# Patient Record
Sex: Male | Born: 1955 | Race: White | Hispanic: No | Marital: Married | Smoking: Never smoker
Health system: Southern US, Community
[De-identification: ages and names within clinical notes are randomized; demographics above are authoritative.]

## PROBLEM LIST (undated history)

## (undated) DIAGNOSIS — M719 Bursopathy, unspecified: Secondary | ICD-10-CM

## (undated) DIAGNOSIS — R42 Dizziness and giddiness: Secondary | ICD-10-CM

## (undated) DIAGNOSIS — J189 Pneumonia, unspecified organism: Secondary | ICD-10-CM

## (undated) DIAGNOSIS — R030 Elevated blood-pressure reading, without diagnosis of hypertension: Secondary | ICD-10-CM

## (undated) DIAGNOSIS — G479 Sleep disorder, unspecified: Secondary | ICD-10-CM

## (undated) DIAGNOSIS — F419 Anxiety disorder, unspecified: Secondary | ICD-10-CM

## (undated) DIAGNOSIS — R232 Flushing: Secondary | ICD-10-CM

## (undated) DIAGNOSIS — Z72820 Sleep deprivation: Secondary | ICD-10-CM

## (undated) HISTORY — DX: Anxiety disorder, unspecified: F41.9

## (undated) HISTORY — PX: APPENDECTOMY: SHX54

## (undated) HISTORY — DX: Pneumonia, unspecified organism: J18.9

## (undated) HISTORY — DX: Bursopathy, unspecified: M71.9

## (undated) HISTORY — DX: Sleep disorder, unspecified: G47.9

## (undated) HISTORY — DX: Flushing: R23.2

## (undated) HISTORY — DX: Sleep deprivation: Z72.820

## (undated) HISTORY — DX: Dizziness and giddiness: R42

## (undated) HISTORY — DX: Elevated blood-pressure reading, without diagnosis of hypertension: R03.0

---

## 2003-03-22 ENCOUNTER — Observation Stay (HOSPITAL_COMMUNITY): Admission: RE | Admit: 2003-03-22 | Discharge: 2003-03-23 | Payer: Self-pay | Admitting: Surgery

## 2003-03-23 ENCOUNTER — Encounter (INDEPENDENT_AMBULATORY_CARE_PROVIDER_SITE_OTHER): Payer: Self-pay | Admitting: *Deleted

## 2010-02-11 DIAGNOSIS — J189 Pneumonia, unspecified organism: Secondary | ICD-10-CM

## 2010-02-11 HISTORY — DX: Pneumonia, unspecified organism: J18.9

## 2010-03-02 ENCOUNTER — Encounter: Payer: Self-pay | Admitting: Critical Care Medicine

## 2010-03-02 ENCOUNTER — Encounter
Admission: RE | Admit: 2010-03-02 | Discharge: 2010-03-02 | Payer: Self-pay | Source: Home / Self Care | Attending: Emergency Medicine | Admitting: Emergency Medicine

## 2010-03-03 ENCOUNTER — Ambulatory Visit (HOSPITAL_COMMUNITY)
Admission: RE | Admit: 2010-03-03 | Discharge: 2010-03-03 | Payer: Self-pay | Source: Home / Self Care | Attending: Emergency Medicine | Admitting: Emergency Medicine

## 2010-03-05 ENCOUNTER — Ambulatory Visit
Admission: RE | Admit: 2010-03-05 | Discharge: 2010-03-05 | Payer: Self-pay | Source: Home / Self Care | Attending: Critical Care Medicine | Admitting: Critical Care Medicine

## 2010-03-15 NOTE — Assessment & Plan Note (Signed)
Summary: Pulmonary Consultation   Copy to:  Dr. Earl Lites Primary Provider/Referring Provider:  [name]  CC:  Pulmonary Consult - .  History of Present Illness: Pulmonary Consultation 55 yo WM with [name] started 4 weeks ago with fever, flu like ilness, cough , temp 100, pt worse since 1/11 .  Pt cough prod of yellow and green mucus.   Pt rx with zpak and then augmentin.   Seen in urgen care, CXR with wedge shaped infiltrate ?PE. pt rx lovenox an no ABX  Pt still has pain wiht deep breath.   Preventive Screening-Counseling & Management  Alcohol-Tobacco     Smoking Status: never  Current Medications (verified): 1)  Lovenox 120 Mg/0.32ml Soln (Enoxaparin Sodium) .... Once Daily  Allergies (verified): No Known Drug Allergies  Past History:  Past medical, surgical, family and social histories (including risk factors) reviewed, and no changes noted (except as noted below).  Past Surgical History: Appendectomy  Family History: Reviewed history and no changes required. PGF - heart disease mother - ovarian ca MGM - breast ca  Social History: Reviewed history and no changes required. 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  The patient denies shortness of breath with activity, shortness of breath at rest, non-productive cough, coughing up blood, irregular heartbeats, acid heartburn, indigestion, loss of appetite, weight change, abdominal pain, difficulty swallowing, sore throat, tooth/dental problems, headaches, nasal congestion/difficulty breathing through nose, sneezing, itching, ear ache, anxiety, depression, hand/feet swelling, joint stiffness or pain, rash, change in color of mucus, and fever.    Vital Signs:  Patient profile:   55 year old male Height:      69.5 inches Weight:      171.13 pounds BMI:      25.00 O2 Sat:      99 % on Room air Temp:     98.4 degrees F oral Pulse rate:   92 / minute BP sitting:   120 / 78  (left arm) Cuff size:   regular  Vitals Entered By: Gweneth Dimitri RN (March 05, 2010 10:41 AM)  O2 Flow:  Room air CC: Pulmonary Consult -  Comments Medications reviewed with patient Daytime contact number verified with patient. Gweneth Dimitri RN  March 05, 2010 10:41 AM    Physical Exam  Additional Exam:  Gen: Pleasant, well-nourished, in no distress,  normal affect ENT: No lesions,  mouth clear,  oropharynx clear, no postnasal drip Neck: No JVD, no TMG, no carotid bruits Lungs: No use of accessory muscles, no dullness to percussion, consolidative changes RML Cardiovascular: RRR, heart sounds normal, no murmur or gallops, no peripheral edema Abdomen: soft and NT, no HSM,  BS normal Musculoskeletal: No deformities, no cyanosis or clubbing Neuro: alert, non focal Skin: Warm, no lesions or rashes    Impression & Recommendations:  Problem # 1:  PNEUMONIA, ORGANISM UNSPECIFIED (ICD-486) Assessment Unchanged CT reviewd,  this is not PE, this is CAP with wedge shaped infiltrate  plan avelox x 7days d/c lovenox His updated medication list for this problem includes:    Avelox 400 Mg Tabs (Moxifloxacin hcl) ..... By mouth daily  Orders: New Patient Level V (28413)  Medications Added to Medication List This Visit: 1)  Avelox 400 Mg Tabs (Moxifloxacin hcl) .... By mouth daily  Complete Medication List: 1)  Avelox  400 Mg Tabs (Moxifloxacin hcl) .... By mouth daily  Patient Instructions: 1)  Stop lovenox,  you do not have a pulmonary embolism 2)  Avelox one daily until gone (2 samples, 5 sent to [facility]) 3)  Push fluids 4)  Return 3-4 weeks with chest xray first  5)  You are safe to travel Prescriptions: AVELOX 400 MG  TABS (MOXIFLOXACIN HCL) By mouth daily  #5 x 0   Entered and Authorized by:   Storm Frisk MD   Signed by:   Storm Frisk MD on  03/05/2010   Method used:   Electronically to        [facility] [address] [phone]* (retail)       [address]       [place], [place]  [address]       Ph: [phone] or [phone]       Fax: [phone]   RxID:   [id number]   Appended Document: Pulmonary Consultation fax steve daub

## 2010-03-17 ENCOUNTER — Encounter: Payer: Self-pay | Admitting: Critical Care Medicine

## 2010-03-18 ENCOUNTER — Encounter: Payer: Self-pay | Admitting: Critical Care Medicine

## 2010-04-06 ENCOUNTER — Ambulatory Visit (INDEPENDENT_AMBULATORY_CARE_PROVIDER_SITE_OTHER): Payer: BC Managed Care – PPO | Admitting: Critical Care Medicine

## 2010-04-06 ENCOUNTER — Encounter: Payer: Self-pay | Admitting: Critical Care Medicine

## 2010-04-06 DIAGNOSIS — J189 Pneumonia, unspecified organism: Secondary | ICD-10-CM

## 2010-04-10 NOTE — Assessment & Plan Note (Signed)
Summary: Pulmonary OV   Copy to:  Dr. Earl Lites Primary Provider/Referring Provider:  Pierce Crane  CC:  4 wk follow up.  Pt states he has improved a great deal.  Denies SOB, wheezing, chest tightness, cough, and back pains..  History of Present Illness: Pulmonary OV 55 yo WM with [name] started 4 weeks ago with fever, flu like ilness, cough , temp 100, pt worse since 1/11 .  Pt cough prod of yellow and green mucus.   Pt rx with zpak and then augmentin.   Seen in urgen care, CXR with wedge shaped infiltrate ?PE. pt rx lovenox an no ABX  Pt still has pain wiht deep breath.    April 06, 2010 9:18 AM no furhter pain or ocugh. The pt notes  in the last week he  is better The pt had abx refilled and did another round  in jan 2012.   Now is better No oether new issues. CXR done 03/18/10: infiltrate had cleared  Current Medications (verified): 1)  None  Allergies (verified): No Known Drug Allergies   Past History:  Past medical, surgical, family and social histories (including risk factors) reviewed, and no changes noted (except as noted below).  Past Surgical History: Reviewed history from 03/05/2010 and no changes required. Appendectomy  Family History: Reviewed history from 03/05/2010 and no changes required. PGF - heart disease mother - ovarian ca MGM - breast ca  Social History: Reviewed history from 03/05/2010 and no changes required. 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    Vital Signs:  Patient profile:   55 year old male Height:      69.5 inches Weight:      173.56 pounds BMI:     25.35 O2 Sat:      99 % on Room air Temp:     98.0 degrees F oral Pulse rate:   76 / minute BP sitting:   122 / 80  (left arm) Cuff size:   regular  Vitals Entered By: Gweneth Dimitri RN (April 06, 2010 9:09 AM)  O2 Flow:  Room air CC: 4 wk follow up.  Pt states he has improved a great deal.  Denies SOB, wheezing, chest tightness, cough, back pains. Comments Medications reviewed with patient Daytime contact number verified with patient. Gweneth Dimitri RN  April 06, 2010 9:11 AM    Physical Exam  Additional Exam:  Gen: Pleasant, well-nourished, in no distress,  normal affect ENT: No lesions,  mouth clear,  oropharynx clear, no postnasal drip Neck: No JVD, no TMG, no carotid bruits Lungs: No use of accessory muscles, no dullness to percussion,  clear Cardiovascular: RRR, heart sounds normal, no murmur or gallops, no peripheral edema Abdomen: soft and NT, no HSM,  BS normal Musculoskeletal: No deformities, no cyanosis or clubbing Neuro: alert, non focal Skin: Warm, no lesions or rashes    CXR  Procedure date:  03/18/2010  Findings:      RUL infiltrate has completely resolved   Impression & Recommendations:  Problem # 1:  PNEUMONIA, ORGANISM UNSPECIFIED (ICD-486) Assessment Improved Resolved CAP RUL.  No pulmonary embolism.  Hypercoaguable w/u neg except for heterozygote for Factor V leiden, likely not pertinent in this case, recent cxr 03/18/10: pna resolved plan return as needed  Orders: T-2 View CXR (71020TC) Est. Patient Level III (30865)  Patient Instructions: 1)  Return as needed 2)  No further lung work up  Appended Document: Pulmonary OV fax Earl Lites

## 2010-04-25 ENCOUNTER — Encounter: Payer: Self-pay | Admitting: Oncology

## 2010-06-29 NOTE — Op Note (Signed)
NAME:  GIORDAN, FORDHAM                         ACCOUNT NO.:  [id number]   MEDICAL RECORD NO.:  [id number]                   PATIENT TYPE:  OBV   LOCATION:  0102                                 FACILITY:  [facility]   PHYSICIAN:  Sharlet Salina T. Hoxworth, M.D.          DATE OF BIRTH:  24-Feb-1955   DATE OF PROCEDURE:  03/22/2003  DATE OF DISCHARGE:                                 OPERATIVE REPORT   PREOPERATIVE DIAGNOSIS:  Acute appendicitis.   POSTOPERATIVE DIAGNOSIS:  Acute appendicitis.   SURGICAL PROCEDURE:  Laparoscopic appendectomy.   SURGEON:  Lorne Skeens. Hoxworth, M.D.   ANESTHESIA:  General.   BRIEF HISTORY:  Mr. Zuercher is a 55 year old male who presents with just over  a 24-hour history of progressive periumbilical and then right lower quadrant  abdominal pain associated with decreased appetite.  He has moderate right  lower quadrant tenderness on exam but no peritoneal signs.  CT scan was  obtained due to an equivocal physical exam, and this shows evidence of early  appendicitis.  Laparoscopic appendectomy has been recommended and accepted.  The nature of the procedure, indications, risks of bleeding, infection were  discussed and understood.  He is now brought to the operating room for the  procedure.   DESCRIPTION OF OPERATION:  The patient was brought to the operating room and  placed in the supine position on the operating table, and a general  endotracheal anesthesia was induced.  He received preoperative broad-  spectrum antibiotics.  A Foley catheter was placed.  The abdomen was widely  sterilely prepped and draped.  Local anesthesia was used to infiltrate the  trocar sites prior to the incision.  A 1 cm incision was made in the  umbilicus and dissection carried down to the midline fascia, which was  sharply incised for 1 cm, the peritoneum entered under direct vision.  Through a mattress suture of 0 Vicryl the Hasson trocar was placed and  pneumoperitoneum established.   Under direct vision a 5 mm trocar was placed  in the right upper quadrant and a 12 mm in the left lower quadrant.  The  appendix was exposed and was acutely inflamed without evidence of gangrene  or perforation.  The appendix was mobilized from inflammatory adhesions and  then further mobilized dividing the lateral peritoneal attachments with the  Harmonic scalpel.  The base was not inflamed.  The mesoappendix was then  sequentially divided with the Harmonic scalpel until the appendix was  completely freed down to its base.  This was then divided across the base  with a single firing of the Endo-GIA red load stapler.  The appendix was  placed in an EndoCatch bag and brought out through the umbilicus.  The  abdomen was then thoroughly irrigated and hemostasis assured.  The trocars  were removed under direct vision and all CO2 evacuated.  The mattress suture  was secured to the umbilicus.  Skin incisions were  closed with interrupted  subcuticular 4-0 Monocryl and Steri-Strips.  Sponge, needle, and instrument  counts were correct.  Dry sterile dressings were applied and the patient  taken to the recovery room in good condition.                                               Lorne Skeens. Hoxworth, M.D.   Tory Emerald  D:  03/22/2003  T:  03/23/2003  Job:  [id number]

## 2012-07-29 ENCOUNTER — Ambulatory Visit (INDEPENDENT_AMBULATORY_CARE_PROVIDER_SITE_OTHER): Payer: BC Managed Care – PPO | Admitting: Family Medicine

## 2012-07-29 VITALS — BP 159/99 | HR 85 | Temp 98.5°F | Resp 16 | Ht 70.0 in | Wt 176.0 lb

## 2012-07-29 DIAGNOSIS — J189 Pneumonia, unspecified organism: Secondary | ICD-10-CM

## 2012-07-29 LAB — POCT CBC
Granulocyte percent: 60.6 %G (ref 37–80)
HCT, POC: 43.7 % (ref 43.5–53.7)
Hemoglobin: 14.3 g/dL (ref 14.1–18.1)
Lymph, poc: 1.9 (ref 0.6–3.4)
MCH, POC: 29.2 pg (ref 27–31.2)
MCHC: 32.7 g/dL (ref 31.8–35.4)
MCV: 89.1 fL (ref 80–97)
MID (cbc): 0.5 (ref 0–0.9)
MPV: 9.4 fL (ref 0–99.8)
POC Granulocyte: 3.6 (ref 2–6.9)
POC LYMPH PERCENT: 31.7 %L (ref 10–50)
POC MID %: 7.7 %M (ref 0–12)
Platelet Count, POC: 248 10*3/uL (ref 142–424)
RBC: 4.9 M/uL (ref 4.69–6.13)
RDW, POC: 12.9 %
WBC: 6 10*3/uL (ref 4.6–10.2)

## 2012-07-29 MED ORDER — AZITHROMYCIN 250 MG PO TABS: ORAL_TABLET | ORAL | Status: DC

## 2012-07-29 NOTE — Progress Notes (Signed)
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 He has a history of pneumonia which began exactly like this in the past. 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 He's had some mild nausea but no vomiting, no diarrhea.  Objective: HEENT unremarkable with exception of some mild uvular erythema. Neck: Supple no adenopathy Chest: Coarse breath sounds bilateral with some rales in the right lower base. Skin: Unremarkable  Heart: Regular no murmur Extremities: No edema.  Assessment: Early pneumonia  Plan: Check CBC, metabolic profile, PSA since patient is not having lab test in 2 years.  Signed, Sheila Oats.D.

## 2012-07-30 LAB — COMPREHENSIVE METABOLIC PANEL
ALT: 18 U/L (ref 0–53)
AST: 18 U/L (ref 0–37)
Albumin: 4.4 g/dL (ref 3.5–5.2)
Alkaline Phosphatase: 42 U/L (ref 39–117)
BUN: 19 mg/dL (ref 6–23)
CO2: 29 mEq/L (ref 19–32)
Calcium: 9.8 mg/dL (ref 8.4–10.5)
Chloride: 101 mEq/L (ref 96–112)
Creat: 1.2 mg/dL (ref 0.50–1.35)
Glucose, Bld: 85 mg/dL (ref 70–99)
Potassium: 4.1 mEq/L (ref 3.5–5.3)
Sodium: 139 mEq/L (ref 135–145)
Total Bilirubin: 0.8 mg/dL (ref 0.3–1.2)
Total Protein: 6.9 g/dL (ref 6.0–8.3)

## 2012-07-30 LAB — PSA: PSA: 0.97 ng/mL (ref <=4.00)

## 2012-08-19 ENCOUNTER — Ambulatory Visit (INDEPENDENT_AMBULATORY_CARE_PROVIDER_SITE_OTHER): Payer: BC Managed Care – PPO | Admitting: Critical Care Medicine

## 2012-08-19 ENCOUNTER — Encounter: Payer: Self-pay | Admitting: Critical Care Medicine

## 2012-08-19 ENCOUNTER — Ambulatory Visit (INDEPENDENT_AMBULATORY_CARE_PROVIDER_SITE_OTHER)
Admission: RE | Admit: 2012-08-19 | Discharge: 2012-08-19 | Disposition: A | Discharge status: Home / Self Care | Payer: BC Managed Care – PPO | Source: Ambulatory Visit | Attending: Critical Care Medicine | Admitting: Critical Care Medicine

## 2012-08-19 ENCOUNTER — Other Ambulatory Visit (INDEPENDENT_AMBULATORY_CARE_PROVIDER_SITE_OTHER): Payer: BC Managed Care – PPO

## 2012-08-19 VITALS — BP 136/82 | HR 94 | Temp 98.3°F | Ht 69.0 in | Wt 178.4 lb

## 2012-08-19 DIAGNOSIS — J189 Pneumonia, unspecified organism: Secondary | ICD-10-CM

## 2012-08-19 DIAGNOSIS — J180 Bronchopneumonia, unspecified organism: Secondary | ICD-10-CM

## 2012-08-19 LAB — CBC WITH DIFFERENTIAL/PLATELET
Basophils Absolute: 0 10*3/uL (ref 0.0–0.1)
Basophils Relative: 0.2 % (ref 0.0–3.0)
Eosinophils Absolute: 0.1 10*3/uL (ref 0.0–0.7)
Hemoglobin: 15.5 g/dL (ref 13.0–17.0)
Lymphocytes Relative: 27 % (ref 12.0–46.0)
MCHC: 34.3 g/dL (ref 30.0–36.0)
MCV: 86 fl (ref 78.0–100.0)
Neutrophils Relative %: 63.4 % (ref 43.0–77.0)
Platelets: 206 10*3/uL (ref 150.0–400.0)
RDW: 13 % (ref 11.5–14.6)
WBC: 6.1 10*3/uL (ref 4.5–10.5)

## 2012-08-19 LAB — BASIC METABOLIC PANEL: CO2: 34 mEq/L — ABNORMAL HIGH (ref 19–32)

## 2012-08-19 MED ORDER — LEVOFLOXACIN 500 MG PO TABS: 500.0000 mg | ORAL_TABLET | Freq: Every day | ORAL | Status: DC

## 2012-08-19 NOTE — Progress Notes (Signed)
Subjective:    Patient ID: Darren Hess, male    DOB: 1955-02-27, 57 y.o.   MRN: [id number]  HPI  08/19/2012 Chief Complaint  Patient presents with  . Acute Visit    Last seen 03/2012.  Soreness in back, fatigue.  Feels same as did in past with pna.    Started illness for 7weeks coughing mucus, yellow. Pain in R film.  No cxr done.  Avelox x 5, then Zpak later. Now is better, pain went away and now back. Ache all night.  Now no fever, none before. No sinus issues.  No edema in legs  Symptoms are very similar to presentation 2012 on right middle lobe pneumonia was diagnosed and successfully treated with Avelox   Pt denies any significant sore throat, nasal congestion or excess secretions, fever, chills, sweats, unintended weight loss, pleurtic or exertional chest pain, orthopnea PND, or leg swelling Pt denies any increase in rescue therapy over baseline, denies waking up needing it or having any early am or nocturnal exacerbations of coughing/wheezing/or dyspnea. Pt also denies any obvious fluctuation in symptoms with  weather or environmental change or other alleviating or aggravating factors    Review of Systems Constitutional:   No  weight loss, night sweats,  Fevers, chills, fatigue, lassitude. HEENT:   No headaches,  Difficulty swallowing,  Tooth/dental problems,  Sore throat,                No sneezing, itching, ear ache, nasal congestion, post nasal drip,   CV:  ++ chest pain,  Orthopnea, PND, swelling in lower extremities, anasarca, dizziness, palpitations  GI  No heartburn, indigestion, abdominal pain, nausea, vomiting, diarrhea, change in bowel habits, loss of appetite  Resp: ++ shortness of breath with exertion not at rest.  No excess mucus, ++ productive cough,  No non-productive cough,  No coughing up of blood.  No change in color of mucus.  No wheezing.  No chest wall deformity  Skin: no rash or lesions.  GU: no dysuria, change in color of urine, no urgency or  frequency.  No flank pain.  MS:  No joint pain or swelling.  No decreased range of motion.  No back pain. Note right posterior chest wall pain and right arm pain  Psych:  No change in mood or affect. No depression or anxiety.  No memory loss.     Objective:   Physical Exam Filed Vitals:   08/19/12 1404  BP: 136/82  Pulse: 94  Temp: 98.3 F (36.8 C)  TempSrc: Oral  Height: 5\' 9"  (1.753 m)  Weight: 178 lb 6.4 oz (80.922 kg)  SpO2: 96%    Gen: Pleasant, well-nourished, in no distress,  normal affect  ENT: No lesions,  mouth clear,  oropharynx clear, no postnasal drip  Neck: No JVD, no TMG, no carotid bruits  Lungs: No use of accessory muscles, no dullness to percussion, faint rales right middle lung zone  Cardiovascular: RRR, heart sounds normal, no murmur or gallops, no peripheral edema  Abdomen: soft and NT, no HSM,  BS normal  Musculoskeletal: No deformities, no cyanosis or clubbing  Neuro: alert, non focal  Skin: Warm, no lesions or rashes  Dg Chest 2 View  08/19/2012   *RADIOLOGY REPORT*  Clinical Data: Back pain, possible pneumonia  CHEST - 2 VIEW  Comparison: 03/02/2010  Findings: Cardiomediastinal silhouette is unremarkable.  No acute infiltrate or pleural effusion.  No pulmonary edema.  Bony thorax is unremarkable.  IMPRESSION: No active disease.  Original Report Authenticated By: Natasha Mead, M.D.          Assessment & Plan:   Bronchopneumonia History right middle lobe pneumonia in 2012 pleural-based now with symptoms very similar to prior presentation. Chest x-ray findings not supportive but physical exam and history suggestive of at least tracheobronchitis with associated mild bronchopneumonia right lung Plan Given extension of antibiotics with Levaquin 500 milligrams daily for 7 days Return in 2 weeks for recheck Note CBC unremarkable at this visit   Updated Medication List Outpatient Encounter Prescriptions as of 08/19/2012  Medication Sig Dispense  Refill  . levofloxacin (LEVAQUIN) 500 MG tablet Take 1 tablet (500 mg total) by mouth daily.  10 tablet  0  . [DISCONTINUED] azithromycin (ZITHROMAX Z-PAK) 250 MG tablet Take as directed on pack  6 tablet  0   No facility-administered encounter medications on file as of 08/19/2012.

## 2012-08-19 NOTE — Patient Instructions (Addendum)
Lab today: BMET, CBC Chest xray today Take levaquin 500mg  daily for 10days Return 2 weeks

## 2012-08-20 DIAGNOSIS — J180 Bronchopneumonia, unspecified organism: Secondary | ICD-10-CM | POA: Insufficient documentation

## 2012-08-20 NOTE — Assessment & Plan Note (Signed)
History right middle lobe pneumonia in 2012 pleural-based now with symptoms very similar to prior presentation. Chest x-ray findings not supportive but physical exam and history suggestive of at least tracheobronchitis with associated mild bronchopneumonia right lung Plan Given extension of antibiotics with Levaquin 500 milligrams daily for 7 days Return in 2 weeks for recheck Note CBC unremarkable at this visit

## 2012-08-21 ENCOUNTER — Ambulatory Visit: Payer: BC Managed Care – PPO | Admitting: Internal Medicine

## 2012-09-04 ENCOUNTER — Ambulatory Visit: Payer: BC Managed Care – PPO | Admitting: Critical Care Medicine

## 2012-11-23 ENCOUNTER — Ambulatory Visit: Payer: BC Managed Care – PPO | Admitting: Emergency Medicine

## 2012-11-30 ENCOUNTER — Other Ambulatory Visit: Payer: Self-pay | Admitting: Radiology

## 2012-11-30 MED ORDER — ALPRAZOLAM 0.5 MG PO TABS: 0.2500 mg | ORAL_TABLET | Freq: Every evening | ORAL | Status: DC | PRN

## 2012-11-30 NOTE — Telephone Encounter (Signed)
Phoned in Alprazolam.

## 2013-02-01 ENCOUNTER — Telehealth: Payer: Self-pay

## 2013-02-01 ENCOUNTER — Ambulatory Visit (INDEPENDENT_AMBULATORY_CARE_PROVIDER_SITE_OTHER): Payer: BC Managed Care – PPO | Admitting: Family Medicine

## 2013-02-01 VITALS — BP 158/92 | HR 101 | Temp 98.0°F | Resp 16 | Ht 70.0 in | Wt 174.0 lb

## 2013-02-01 DIAGNOSIS — R42 Dizziness and giddiness: Secondary | ICD-10-CM

## 2013-02-01 DIAGNOSIS — F411 Generalized anxiety disorder: Secondary | ICD-10-CM

## 2013-02-01 DIAGNOSIS — R232 Flushing: Secondary | ICD-10-CM

## 2013-02-01 DIAGNOSIS — R05 Cough: Secondary | ICD-10-CM

## 2013-02-01 DIAGNOSIS — F419 Anxiety disorder, unspecified: Secondary | ICD-10-CM

## 2013-02-01 DIAGNOSIS — IMO0001 Reserved for inherently not codable concepts without codable children: Secondary | ICD-10-CM

## 2013-02-01 DIAGNOSIS — R030 Elevated blood-pressure reading, without diagnosis of hypertension: Secondary | ICD-10-CM

## 2013-02-01 DIAGNOSIS — Z72820 Sleep deprivation: Secondary | ICD-10-CM

## 2013-02-01 DIAGNOSIS — R059 Cough, unspecified: Secondary | ICD-10-CM

## 2013-02-01 DIAGNOSIS — G479 Sleep disorder, unspecified: Secondary | ICD-10-CM

## 2013-02-01 HISTORY — DX: Flushing: R23.2

## 2013-02-01 HISTORY — DX: Sleep disorder, unspecified: G47.9

## 2013-02-01 HISTORY — DX: Sleep deprivation: Z72.820

## 2013-02-01 HISTORY — DX: Dizziness and giddiness: R42

## 2013-02-01 HISTORY — DX: Reserved for inherently not codable concepts without codable children: IMO0001

## 2013-02-01 LAB — POCT CBC
Granulocyte percent: 69.8 %G (ref 37–80)
HCT, POC: 49.1 % (ref 43.5–53.7)
Hemoglobin: 15.7 g/dL (ref 14.1–18.1)
Lymph, poc: 2.4 (ref 0.6–3.4)
MCV: 90.5 fL (ref 80–97)
MPV: 9.2 fL (ref 0–99.8)
POC Granulocyte: 7.2 — AB (ref 2–6.9)
POC LYMPH PERCENT: 23.4 %L (ref 10–50)
RBC: 5.42 M/uL (ref 4.69–6.13)
RDW, POC: 13.4 %
WBC: 10.3 10*3/uL — AB (ref 4.6–10.2)

## 2013-02-01 MED ORDER — ZOLPIDEM TARTRATE 5 MG PO TABS: 5.0000 mg | ORAL_TABLET | Freq: Every evening | ORAL | Status: DC | PRN

## 2013-02-01 NOTE — Telephone Encounter (Signed)
Fayette needs to come in and be checked. He needs a good thorough physical examination and evaluation to see what is the appropriate medications for him to be on. He can certainly come into the walk-in clinic now go ahead and get started on medication and then I can followup with him by appointment.

## 2013-02-01 NOTE — Telephone Encounter (Signed)
Left message for him to call me, he should come in for this. To you FYI

## 2013-02-01 NOTE — Progress Notes (Signed)
Subjective: All male patient of Dr. Deforest Hoyles who is here with a history of noting his blood pressure be elevated over the last few weeks. He  has felt a little abnormal in his head with a little mild dizziness but no loss of coordination or inability to do things from it. He has felt flushed for the last few days. He's had a little nonspecific rash below his breast level on the lower chest wall anteriorly. He has not been able to sleep. They have had a lot of stress worrying about her son who had major liver problems and a critical care hospital stay. Generally he has been healthy. He does not smoke. He is on a regular medications. He normally exercises on a regular basis, but had to stop that about 6 months ago when he got bursitis in his right shoulder. Recently had a cortisone shot in the right shoulder by Dr. Rennis Chris. His usual blood pressures been around 120/70, but over the last couple of weeks she's been creeping upward and is in the 140s over low 90s area. 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  Objective: Well-developed well-nourished anxious-appearing man in no major distress. He is a little flushed appearing, but I have not seen him before. His TMs are normal. Eyes PERRLA. Fundi benign. EOMs intact. Throat clear. Neck supple without nodes or thyromegaly. No carotid bruits. Chest clear to auscultation. Heart regular without murmurs gallops or arrhythmias. 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  Assessment: Elevated blood pressure Anxiety Sleep deprivation Flushed skin  Plan: EKG TSH, comprehensive metabolic panel, CBC  EKG is normal as is the CBC  Return in several weeks for a physical

## 2013-02-01 NOTE — Patient Instructions (Signed)
Take Ambien at bedtime. Take this consistently for a week or so, and then cut back to at an as-needed basis if you don't feel good and tired.  Regular exercise  Try to schedule a physical in the next few weeks  Monitor your blood pressure and return in a list of readings to me in a week or 10 days  At anytime return if abruptly worse

## 2013-02-01 NOTE — Telephone Encounter (Signed)
pts wife has called to advise she is very concerned about her husband. She states he has a BP of 149/99 and pulse of 95, wife states she is trying to get him to come in this evening but wants to know if theres anything advise dr Cleta Alberts can give her Please call wife, kim Mcclellan at [phone]

## 2013-02-02 LAB — COMPREHENSIVE METABOLIC PANEL
ALT: 20 U/L (ref 0–53)
BUN: 29 mg/dL — ABNORMAL HIGH (ref 6–23)
CO2: 26 mEq/L (ref 19–32)
Creat: 1.09 mg/dL (ref 0.50–1.35)
Glucose, Bld: 88 mg/dL (ref 70–99)
Potassium: 3.9 mEq/L (ref 3.5–5.3)
Sodium: 137 mEq/L (ref 135–145)

## 2013-02-02 NOTE — Telephone Encounter (Signed)
Thanks for following through

## 2013-02-02 NOTE — Telephone Encounter (Signed)
He did come in yesterday as advised. FYI to you.

## 2013-04-02 ENCOUNTER — Encounter: Payer: Self-pay | Admitting: Internal Medicine

## 2013-04-02 ENCOUNTER — Ambulatory Visit (INDEPENDENT_AMBULATORY_CARE_PROVIDER_SITE_OTHER): Payer: BC Managed Care – PPO | Admitting: Internal Medicine

## 2013-04-02 VITALS — BP 136/84 | HR 85 | Ht 70.0 in | Wt 175.0 lb

## 2013-04-02 DIAGNOSIS — I10 Essential (primary) hypertension: Secondary | ICD-10-CM

## 2013-04-02 DIAGNOSIS — R030 Elevated blood-pressure reading, without diagnosis of hypertension: Secondary | ICD-10-CM

## 2013-04-02 DIAGNOSIS — R5383 Other fatigue: Secondary | ICD-10-CM

## 2013-04-02 DIAGNOSIS — R5381 Other malaise: Secondary | ICD-10-CM

## 2013-04-02 DIAGNOSIS — Z8249 Family history of ischemic heart disease and other diseases of the circulatory system: Secondary | ICD-10-CM

## 2013-04-02 DIAGNOSIS — IMO0001 Reserved for inherently not codable concepts without codable children: Secondary | ICD-10-CM

## 2013-04-02 NOTE — Patient Instructions (Signed)
Your physician recommends that you schedule a follow-up appointment in: 6 weeks with Dr Rayann Heman to discuss test   Your physician has requested that you have an exercise tolerance test. For further information please visit [email or web address]. Please also follow instruction sheet, as given.  Your physician recommends that you return for lab work fasting NMR with lipid/ LPa  If GXT is normal will get a Calcium Score  Sodium-Controlled Diet Sodium is a mineral. It is found in many foods. Sodium may be found naturally or added during the making of a food. The most common form of sodium is salt, which is made up of sodium and chloride. Reducing your sodium intake involves changing your eating habits. The following guidelines will help you reduce the sodium in your diet:  Stop using the salt shaker.  Use salt sparingly in cooking and baking.  Substitute with sodium-free seasonings and spices.  Do not use a salt substitute (potassium chloride) without your caregiver's permission.  Include a variety of fresh, unprocessed foods in your diet.  Limit the use of processed and convenience foods that are high in sodium. USE THE FOLLOWING FOODS SPARINGLY: Breads/Starches  Commercial bread stuffing, commercial pancake or waffle mixes, coating mixes. Waffles. Croutons. Prepared (boxed or frozen) potato, rice, or noodle mixes that contain salt or sodium. Salted [place] fries or hash browns. Salted popcorn, breads, crackers, chips, or snack foods. Vegetables  Vegetables canned with salt or prepared in cream, butter, or cheese sauces. Sauerkraut. Tomato or vegetable juices canned with salt.  Fresh vegetables are allowed if rinsed thoroughly. Fruit  Fruit is okay to eat. Meat and Meat Substitutes  Salted or smoked meats, such as bacon or Canadian bacon, chipped or corned beef, hot dogs, salt pork, luncheon meats, pastrami, ham, or sausage. Canned or smoked fish, poultry, or meat. Processed cheese or  cheese spreads, blue or [place] cheese. Battered or frozen fish products. Prepared spaghetti sauce. Baked beans. Reuben sandwiches. Salted nuts. Caviar. Milk  Limit buttermilk to 1 cup per week. Soups and Combination Foods  Bouillon cubes, canned or dried soups, broth, consomm. Convenience (frozen or packaged) dinners with more than 600 mg sodium. Pot pies, pizza, Asian food, fast food cheeseburgers, and specialty sandwiches. Desserts and Sweets  Regular (salted) desserts, pie, commercial fruit snack pies, commercial snack cakes, canned puddings.  Eat desserts and sweets in moderation. Fats and Oils  Gravy mixes or canned gravy. No more than 1 to 2 tbs of salad dressing. Chip dips.  Eat fats and oils in moderation. Beverages  See those listed under the vegetables and milk groups. Condiments  Ketchup, mustard, meat sauces, salsa, regular (salted) and lite soy sauce or mustard. Dill pickles, olives, meat tenderizer. Prepared horseradish or pickle relish. Dutch-processed cocoa. Baking powder or baking soda used medicinally. Worcestershire sauce. "Light" salt. Salt substitute, unless approved by your caregiver. Document Released: 07/20/2001 Document Revised: 04/22/2011 Document Reviewed: 02/20/2009 [facility] Patient Information 2014 [place], [place].

## 2013-04-04 ENCOUNTER — Encounter: Payer: Self-pay | Admitting: Internal Medicine

## 2013-04-04 DIAGNOSIS — R030 Elevated blood-pressure reading, without diagnosis of hypertension: Secondary | ICD-10-CM

## 2013-04-04 DIAGNOSIS — IMO0001 Reserved for inherently not codable concepts without codable children: Secondary | ICD-10-CM | POA: Insufficient documentation

## 2013-04-04 DIAGNOSIS — R5383 Other fatigue: Secondary | ICD-10-CM | POA: Insufficient documentation

## 2013-04-04 NOTE — Progress Notes (Signed)
Primary Care Physician: Jenny Reichmann, MD   Darren Hess is a 58 y.o. male with a h/o recently elevated blood pressure and dizziness who presents today for cardiology evaluation.  He is typically quite healthy and exercises regularly.  He feels that his exercise tolerance remains stable.  He has some fatigue at times.  He has noticed recently that his blood pressure is elevated.  He has family history of CAD and therefore presents today for cardiology assessment.  He reports occasional dizziness which appears to be mostly postural.  He denies presyncope or syncope.  He occasionally has difficulty sleep and reports having significant stress over the past few months.  Today, he denies symptoms of palpitations, chest pain, shortness of breath, orthopnea, PND, lower extremity edema, or neurologic sequela. The patient is tolerating medications without difficulties and is otherwise without complaint today.   Past Medical History  Diagnosis Date  . Dizziness 02/01/13    Little mild dizziness with no loss of coordination or inability to do things from it.   . Sleep difficulties 02/01/13    Under a lot of stress about son who has major liver problems  . Bursitis     right shoulder - cortisone shot by Dr. Onnie Graham  . Elevated BP 02/01/13    Elevated over past few weeks  . Anxiety   . Sleep deprivation 02/01/13    Under a lot of stress about son who has major liver problems  . Skin flushed 02/01/13  . Pneumonia 2012    right middle lobe   Past Surgical History  Procedure Laterality Date  . Appendectomy      No current outpatient prescriptions on file.   No current facility-administered medications for this visit.    No Known Allergies  History   Social History  . Marital Status: Single    Spouse Name: N/A    Number of Children: N/A  . Years of Education: N/A   Occupational History  . Not on file.   Social History Main Topics  . Smoking status: Never Smoker   . Smokeless  tobacco: Not on file  . Alcohol Use: Yes     Comment: 1 glass of wine with dinner about twice per week  . Drug Use: No  . Sexual Activity: Yes   Other Topics Concern  . Not on file   Social History Narrative   Pt lives in [place].     Dentist   Trained at [place]          Family History  Problem Relation Age of Onset  . CAD Father   . CAD Paternal Grandfather   . CAD Paternal Uncle   . Cancer Mother     age 36    ROS- All systems are reviewed and negative except as per the HPI above  Physical Exam: Filed Vitals:   04/02/13 0904  BP: 136/84  Pulse: 85  Height: 5\' 10"  (1.778 m)  Weight: 175 lb (79.379 kg)    GEN- The patient is well appearing, alert and oriented x 3 today.   Head- normocephalic, atraumatic Eyes-  Sclera clear, conjunctiva pink Ears- hearing intact Oropharynx- clear Neck- supple, no JVP Lymph- no cervical lymphadenopathy Lungs- Clear to ausculation bilaterally, normal work of breathing Heart- Regular rate and rhythm, no murmurs, rubs or gallops, PMI not laterally displaced GI- soft, NT, ND, + BS Extremities- no clubbing, cyanosis, or edema MS- no significant deformity or atrophy Skin- no rash or lesion Psych- euthymic mood,  full affect Neuro- strength and sensation are intact  EKG today reveals sinus rhythm 74 bpm, otherwise normal ekg  Assessment and Plan:  1. Elevated blood pressure I had a long discussion with the patient today.  He is clear that he would like to avoid medicine if possible.  He will therefore work on lifestyle modification including salt restriction and stess modification.  2. Fatigue/ family history of cad/ concerns of heart disease The patient appears to be reasonably healthy but is limited by concerns of heart disease.  This likely arises from his family history and fathers difficulty with CAD/ aneurysm.  I will order a GXT and lipid profile to further risk stratify.  If his GXT is normal then I will also order a  calcium score.   He will return in 6 weeks to discuss these results.

## 2013-04-16 ENCOUNTER — Ambulatory Visit (HOSPITAL_COMMUNITY)
Admission: RE | Admit: 2013-04-16 | Discharge: 2013-04-16 | Disposition: A | Discharge status: Home / Self Care | Payer: BC Managed Care – PPO | Source: Ambulatory Visit | Attending: Cardiovascular Disease | Admitting: Cardiovascular Disease

## 2013-04-16 DIAGNOSIS — I10 Essential (primary) hypertension: Secondary | ICD-10-CM | POA: Insufficient documentation

## 2013-04-16 DIAGNOSIS — Z8249 Family history of ischemic heart disease and other diseases of the circulatory system: Secondary | ICD-10-CM | POA: Insufficient documentation

## 2013-04-27 ENCOUNTER — Other Ambulatory Visit: Payer: Self-pay | Admitting: *Deleted

## 2013-04-27 DIAGNOSIS — IMO0001 Reserved for inherently not codable concepts without codable children: Secondary | ICD-10-CM

## 2013-04-27 DIAGNOSIS — Z8249 Family history of ischemic heart disease and other diseases of the circulatory system: Secondary | ICD-10-CM

## 2013-04-27 DIAGNOSIS — R030 Elevated blood-pressure reading, without diagnosis of hypertension: Secondary | ICD-10-CM

## 2013-04-27 DIAGNOSIS — I10 Essential (primary) hypertension: Secondary | ICD-10-CM

## 2013-04-27 DIAGNOSIS — R5383 Other fatigue: Secondary | ICD-10-CM

## 2013-04-30 ENCOUNTER — Ambulatory Visit: Payer: BC Managed Care – PPO | Admitting: *Deleted

## 2013-04-30 ENCOUNTER — Ambulatory Visit (INDEPENDENT_AMBULATORY_CARE_PROVIDER_SITE_OTHER)
Admission: RE | Admit: 2013-04-30 | Discharge: 2013-04-30 | Disposition: A | Discharge status: Home / Self Care | Payer: Self-pay | Source: Ambulatory Visit | Attending: Internal Medicine | Admitting: Internal Medicine

## 2013-04-30 DIAGNOSIS — R030 Elevated blood-pressure reading, without diagnosis of hypertension: Secondary | ICD-10-CM

## 2013-04-30 DIAGNOSIS — IMO0001 Reserved for inherently not codable concepts without codable children: Secondary | ICD-10-CM

## 2013-04-30 DIAGNOSIS — Z8249 Family history of ischemic heart disease and other diseases of the circulatory system: Secondary | ICD-10-CM

## 2013-04-30 DIAGNOSIS — R5381 Other malaise: Secondary | ICD-10-CM

## 2013-04-30 DIAGNOSIS — R5383 Other fatigue: Secondary | ICD-10-CM

## 2013-04-30 DIAGNOSIS — I10 Essential (primary) hypertension: Secondary | ICD-10-CM

## 2013-05-01 LAB — LIPOPROTEIN A (LPA): Lipoprotein (a): 8 mg/dL (ref 0–30)

## 2013-05-04 LAB — NMR LIPOPROFILE WITH LIPIDS
CHOLESTEROL, TOTAL: 189 mg/dL (ref <200)
HDL Particle Number: 39.2 umol/L (ref >=30.5)
HDL Size: 8.8 nm — ABNORMAL LOW (ref >=9.2)
HDL-C: 64 mg/dL (ref >=40)
LDL (calc): 116 mg/dL — ABNORMAL HIGH (ref <100)
LDL PARTICLE NUMBER: 1326 nmol/L — AB (ref <1000)
LDL Size: 21.2 nm (ref >20.5)
Large HDL-P: 5.6 umol/L (ref >=4.8)
SMALL LDL PARTICLE NUMBER: 173 nmol/L (ref <=527)
TRIGLYCERIDES: 47 mg/dL (ref <150)
VLDL SIZE: 35.4 nm (ref <=46.6)

## 2013-06-18 ENCOUNTER — Ambulatory Visit: Payer: BC Managed Care – PPO | Admitting: Internal Medicine

## 2014-11-04 ENCOUNTER — Other Ambulatory Visit: Payer: Self-pay

## 2014-11-15 ENCOUNTER — Encounter: Payer: Self-pay | Admitting: Emergency Medicine

## 2014-11-25 ENCOUNTER — Other Ambulatory Visit: Payer: Self-pay

## 2015-03-03 ENCOUNTER — Encounter: Payer: Self-pay | Admitting: Podiatry

## 2015-03-03 ENCOUNTER — Ambulatory Visit (INDEPENDENT_AMBULATORY_CARE_PROVIDER_SITE_OTHER): Payer: BC Managed Care – PPO

## 2015-03-03 ENCOUNTER — Ambulatory Visit (INDEPENDENT_AMBULATORY_CARE_PROVIDER_SITE_OTHER): Payer: BLUE CROSS/BLUE SHIELD | Admitting: Podiatry

## 2015-03-03 VITALS — BP 96/75 | HR 79 | Resp 16 | Ht 70.0 in | Wt 175.0 lb

## 2015-03-03 DIAGNOSIS — M79674 Pain in right toe(s): Secondary | ICD-10-CM | POA: Diagnosis not present

## 2015-03-03 DIAGNOSIS — M79675 Pain in left toe(s): Secondary | ICD-10-CM | POA: Diagnosis not present

## 2015-03-03 DIAGNOSIS — M205X9 Other deformities of toe(s) (acquired), unspecified foot: Secondary | ICD-10-CM

## 2015-03-03 DIAGNOSIS — M779 Enthesopathy, unspecified: Secondary | ICD-10-CM | POA: Diagnosis not present

## 2015-03-03 MED ORDER — TRIAMCINOLONE ACETONIDE 10 MG/ML IJ SUSP
10.0000 mg | Freq: Once | INTRAMUSCULAR | Status: AC
  Administered 2015-03-03: 10 mg

## 2015-03-03 NOTE — Progress Notes (Signed)
Subjective:     Patient ID: Darren Hess, male   DOB: July 31, 1955, 60 y.o.   MRN: [id number]  HPI patient presents stating both my big toe joints seem to give me trouble but the right one has been quite sore and I don't think I have good range of motion   Review of Systems  All other systems reviewed and are negative.      Objective:   Physical Exam  Constitutional: He is oriented to person, place, and time.  Cardiovascular: Intact distal pulses.   Musculoskeletal: Normal range of motion.  Neurological: He is oriented to person, place, and time.  Skin: Skin is warm.  Nursing note and vitals reviewed.  neurovascular status intact muscle strength adequate range of motion within normal limits with patient found to have limited range of motion first MPJ right over left with pain and inflammation around the first MPJ right upon palpation. 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 Today I went ahead and explained hallux limitus condition and the fact it may progress and ultimately require surgery. I injected around the joint surface 3 mg Kenalog 5 mg Xylocaine and scanned for custom orthotic with a graphite extension to try to reduce stress against the big toe joint and will reappoint when ready

## 2015-03-03 NOTE — Progress Notes (Signed)
   Subjective:    Patient ID: Darren Hess, male    DOB: 1955/05/14, 60 y.o.   MRN: [id number]  HPI Patient presents with toe pain in their Right foot, great toe; pt stated, "joint hurts"; on and off; x3 months.  Pt is a Pharmacist, community.   Review of Systems  All other systems reviewed and are negative.      Objective:   Physical Exam        Assessment & Plan:

## 2015-03-31 ENCOUNTER — Encounter: Payer: Self-pay | Admitting: Podiatry

## 2015-03-31 ENCOUNTER — Ambulatory Visit (INDEPENDENT_AMBULATORY_CARE_PROVIDER_SITE_OTHER): Payer: BLUE CROSS/BLUE SHIELD | Admitting: Podiatry

## 2015-03-31 VITALS — BP 146/95 | HR 82 | Resp 16

## 2015-03-31 DIAGNOSIS — M779 Enthesopathy, unspecified: Secondary | ICD-10-CM | POA: Diagnosis not present

## 2015-03-31 DIAGNOSIS — M205X9 Other deformities of toe(s) (acquired), unspecified foot: Secondary | ICD-10-CM | POA: Diagnosis not present

## 2015-03-31 DIAGNOSIS — M79674 Pain in right toe(s): Secondary | ICD-10-CM

## 2015-03-31 DIAGNOSIS — M79675 Pain in left toe(s): Secondary | ICD-10-CM

## 2015-03-31 NOTE — Patient Instructions (Signed)

## 2015-03-31 NOTE — Progress Notes (Signed)
Subjective:     Patient ID: Darren Hess, male   DOB: 1955/07/25, 60 y.o.   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 Patient will be seen back for [place] to recheck again in the next 6 weeks and we will make a decision on what to do long-term

## 2015-03-31 NOTE — Progress Notes (Signed)
   Subjective:    Patient ID: Darren Hess, male    DOB: August 30, 1955, 60 y.o.   MRN: [id number]  HPI  PUO on 03/31/15; pt stated, "feels like flattening out my foot"  Review of Systems     Objective:   Physical Exam        Assessment & Plan:

## 2015-05-12 ENCOUNTER — Ambulatory Visit: Payer: BLUE CROSS/BLUE SHIELD | Admitting: Podiatry

## 2015-07-21 ENCOUNTER — Other Ambulatory Visit: Payer: Self-pay | Admitting: Physician Assistant

## 2015-07-21 DIAGNOSIS — R1031 Right lower quadrant pain: Secondary | ICD-10-CM

## 2015-07-21 DIAGNOSIS — R1032 Left lower quadrant pain: Secondary | ICD-10-CM

## 2015-07-21 DIAGNOSIS — R1084 Generalized abdominal pain: Secondary | ICD-10-CM

## 2015-07-28 ENCOUNTER — Ambulatory Visit
Admission: RE | Admit: 2015-07-28 | Discharge: 2015-07-28 | Disposition: A | Discharge status: Home / Self Care | Payer: BLUE CROSS/BLUE SHIELD | Source: Ambulatory Visit | Attending: Physician Assistant | Admitting: Physician Assistant

## 2015-07-28 DIAGNOSIS — R1031 Right lower quadrant pain: Secondary | ICD-10-CM

## 2015-07-28 DIAGNOSIS — R1032 Left lower quadrant pain: Secondary | ICD-10-CM

## 2015-07-28 DIAGNOSIS — R1084 Generalized abdominal pain: Secondary | ICD-10-CM

## 2015-07-28 MED ORDER — IOPAMIDOL (ISOVUE-300) INJECTION 61%
100.0000 mL | Freq: Once | INTRAVENOUS | Status: AC | PRN
  Administered 2015-07-28: 100 mL via INTRAVENOUS

## 2016-03-15 LAB — HM COLONOSCOPY

## 2017-12-06 IMAGING — CT CT ABD-PELV W/ CM
3 of 5 series · 12 of 36 positions shown, 18 images · IV contrast (READICAT/WATER & [ID] ISOVUE 300)
Comparison: CT Abdomen and Pelvis 03/22/2003

CLINICAL DATA: 59-year-old male with right and left lower quadrant
pain and burning for 2 months. Appendectomy 10 years ago. Initial
encounter.

EXAM:
CT ABDOMEN AND PELVIS WITH CONTRAST
TECHNIQUE: Multidetector CT imaging of the abdomen and pelvis was performed
using the standard protocol following bolus administration of
intravenous contrast.
CONTRAST:  100mL 1OFTDK-MFF IOPAMIDOL (1OFTDK-MFF) INJECTION 61%

[Series 3: abd/pelvis with · axial · 0.72mm/px · z∈[-310,+30]mm · 8 of 88 slices shown, 13 images]
[im 10/88  soft-tissue]
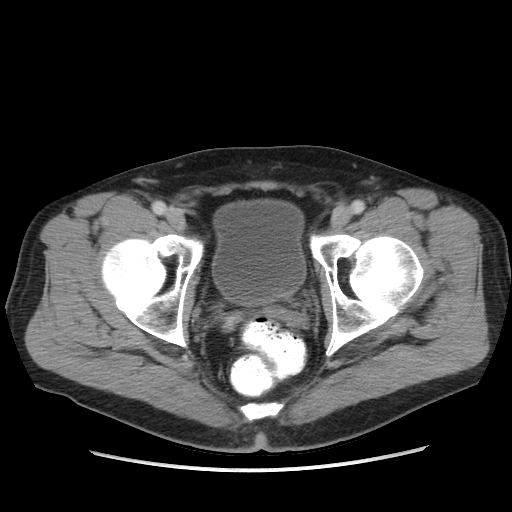
[im 10/88  bone]
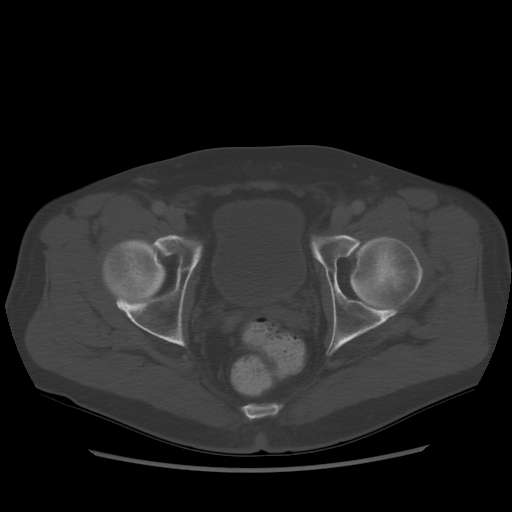
[im 20/88  soft-tissue]
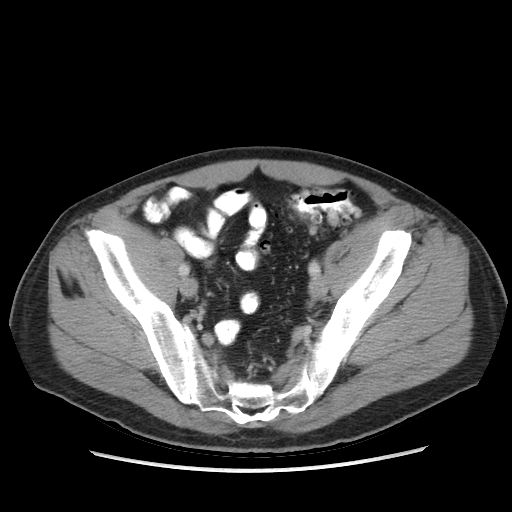
[im 30/88  soft-tissue]
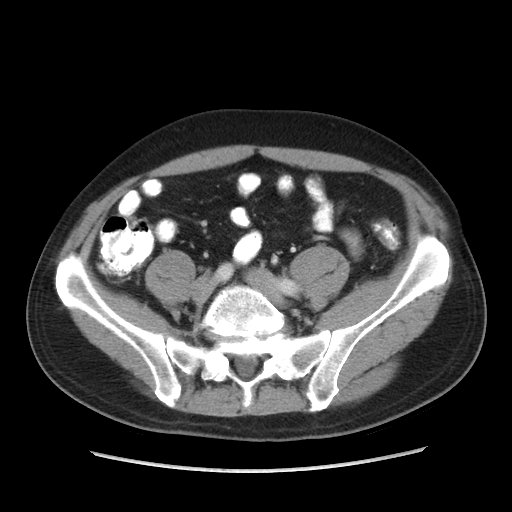
[im 39/88  soft-tissue]
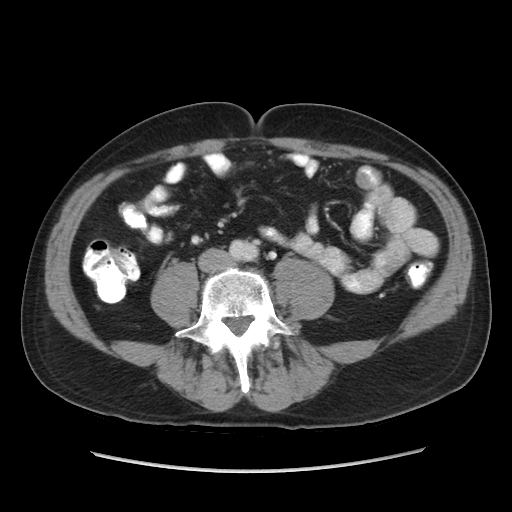
[im 49/88  soft-tissue]
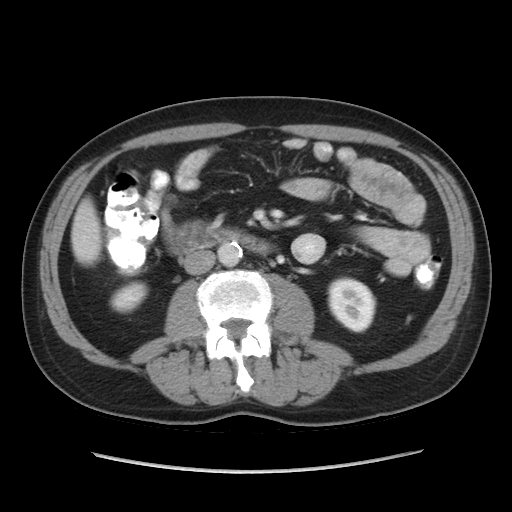
[im 49/88  lung]
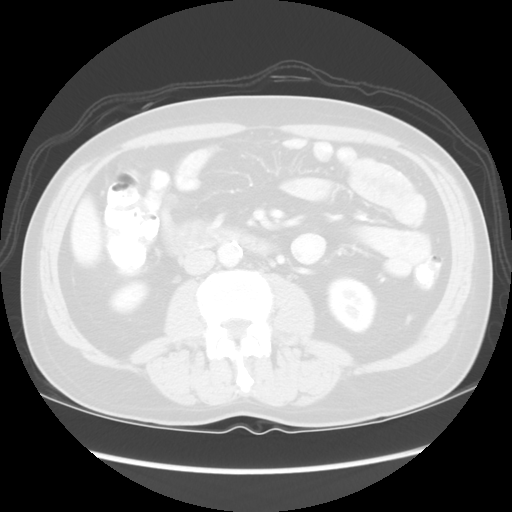
[im 59/88  soft-tissue]
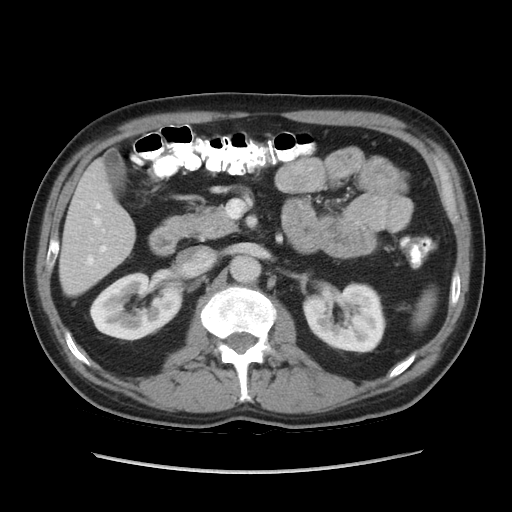
[im 59/88  lung]
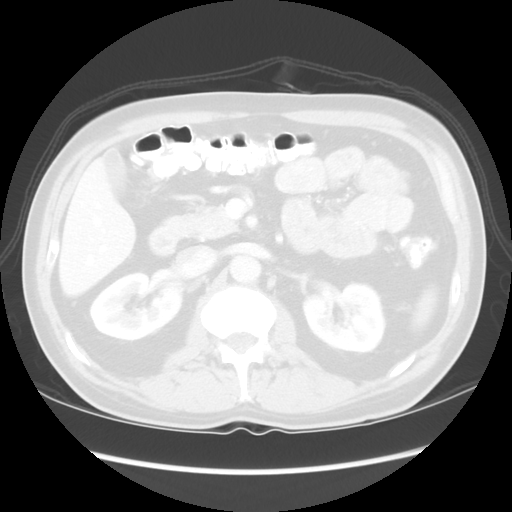
[im 68/88  soft-tissue]
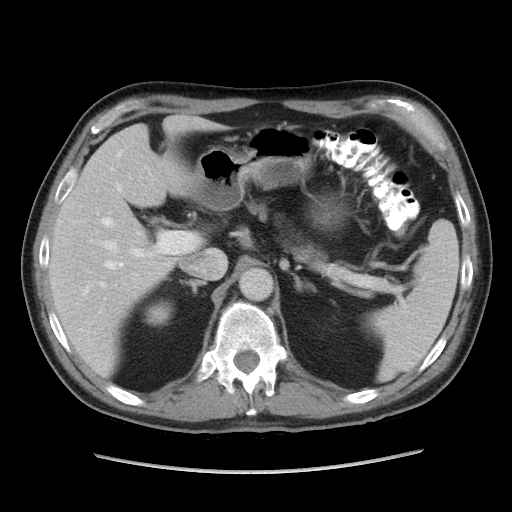
[im 68/88  lung]
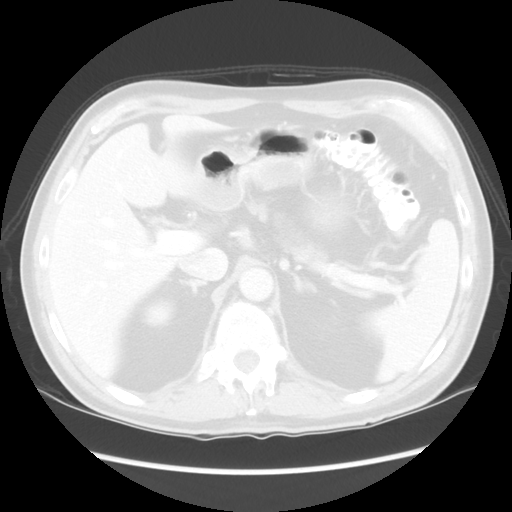
[im 78/88  soft-tissue]
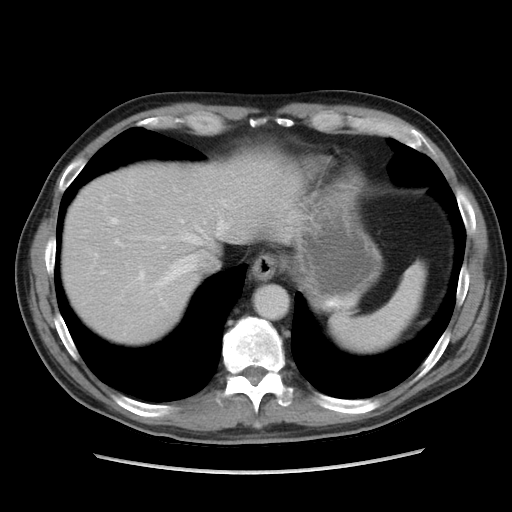
[im 78/88  lung]
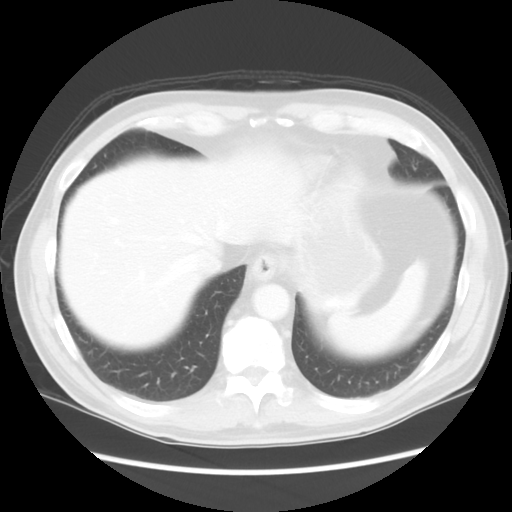

[Series 601: coronal body · coronal · 0.95mm/px · 1 of 111 slices shown, 2 images]
[im 37/111  soft-tissue]
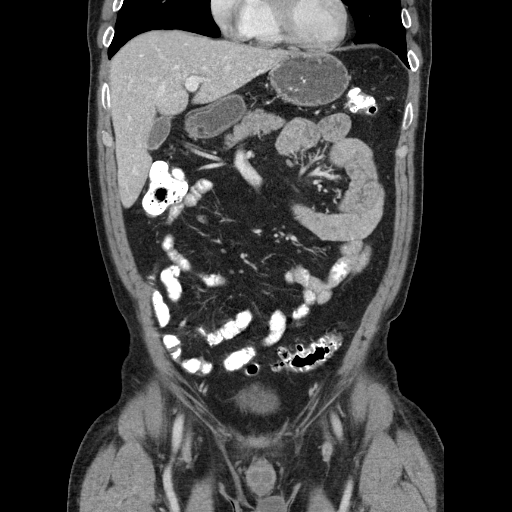
[im 37/111  bone]
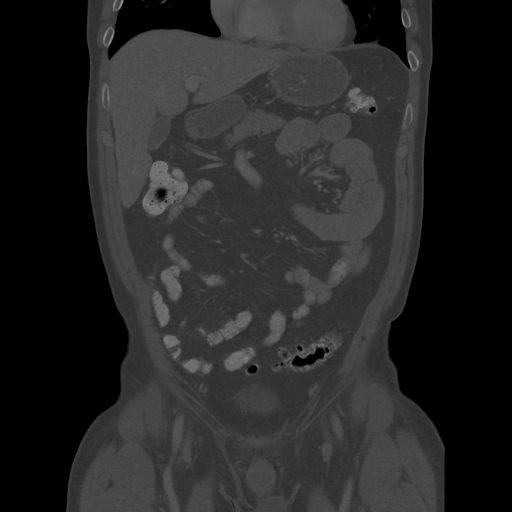

[Series 602: sagittal body · sagittal · 0.95mm/px · 3 of 147 slices shown]
[im 10/147  soft-tissue]
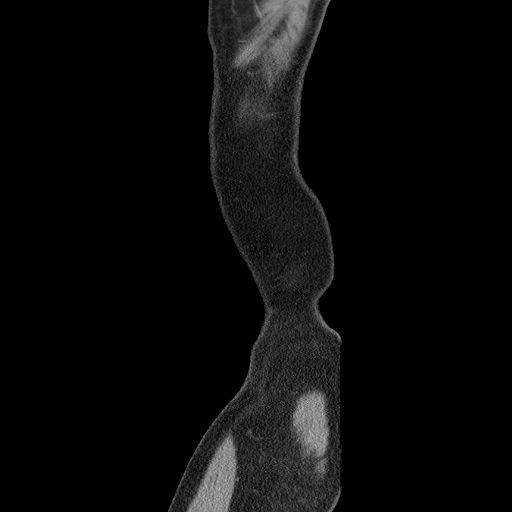
[im 30/147  soft-tissue]
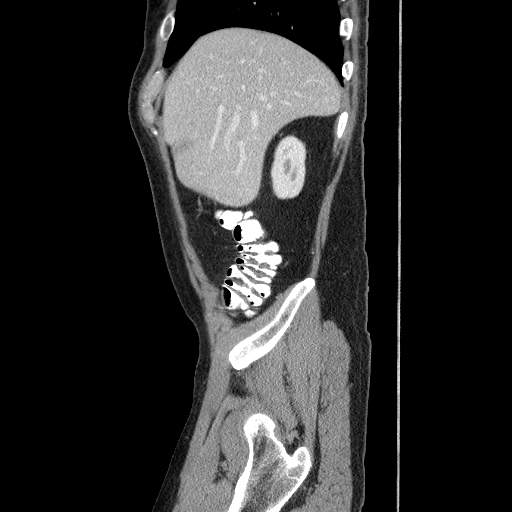
[im 49/147  soft-tissue]
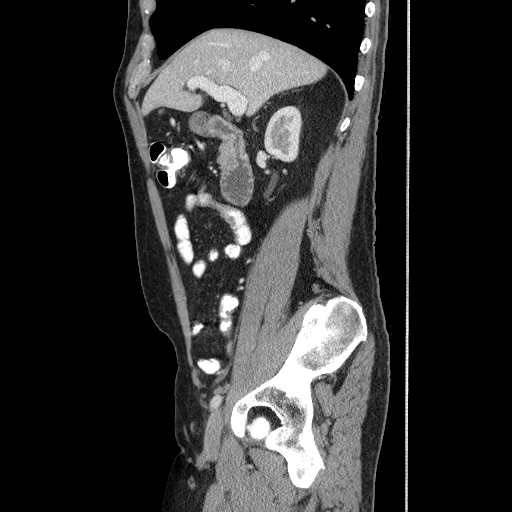

[12 of 36 positions shown; findings below may reference images not displayed]

FINDINGS: Stable and negative lung bases.  No pericardial or pleural effusion.

Chronic L5 pars fractures with grade 1 anterolisthesis of L5 on S1
measuring 5 mm. No acute osseous abnormality identified. There is
mild linear hyper enhancement in the dorsal lower thoracic spinal
canal at T10-T11 (sagittal image 74), but this is stable since 8556
and most likely reflects physiologic vascularity.

No pelvic free fluid. Oral contrast has reached the rectum without
obstruction. Unremarkable urinary bladder.

Moderate to severe sigmoid diverticulosis with mild progression
since 8556. No active inflammation is identified. Mild involvement
of the distal left colon while the remainder of the descending colon
appears spared. Negative splenic flexure. Negative transverse colon.
Occasional diverticula in the right colon (series 3, image 46) with
no active inflammation. Negative cecum and terminal ileum. No
dilated or abnormal small bowel loops. Negative stomach and
duodenum.

No abdominal free air or free fluid. Liver, gallbladder, spleen,
pancreas and adrenal glands are within normal limits. Portal venous
system is patent. Aortoiliac calcified atherosclerosis noted. Major
arterial structures are patent. Bilateral renal enhancement and
contrast excretion is normal. There is a 3 mm nonobstructing
calculus in the right kidney lower pole (series 3, image 34). No
other urologic calculus identified. Negative course of both ureters.
No lymphadenopathy.
IMPRESSION: 1. Severe sigmoid diverticulosis, and occasional colonic diverticula
elsewhere, but no active inflammation to suggest diverticulitis.
2. No acute or inflammatory process identified in the abdomen or
pelvis.
3. Mild right nephrolithiasis without obstruction.
4. Calcified aortic atherosclerosis.
5. Chronic bilateral L5 pars fractures with Grade 1 L5-S1
spondylolisthesis.

## 2019-11-18 DIAGNOSIS — L905 Scar conditions and fibrosis of skin: Secondary | ICD-10-CM | POA: Diagnosis not present

## 2019-11-18 DIAGNOSIS — D0372 Melanoma in situ of left lower limb, including hip: Secondary | ICD-10-CM | POA: Diagnosis not present

## 2020-01-28 DIAGNOSIS — Z85820 Personal history of malignant melanoma of skin: Secondary | ICD-10-CM | POA: Diagnosis not present

## 2020-01-28 DIAGNOSIS — C44729 Squamous cell carcinoma of skin of left lower limb, including hip: Secondary | ICD-10-CM | POA: Diagnosis not present

## 2020-01-28 DIAGNOSIS — L570 Actinic keratosis: Secondary | ICD-10-CM | POA: Diagnosis not present

## 2020-01-28 DIAGNOSIS — Z85828 Personal history of other malignant neoplasm of skin: Secondary | ICD-10-CM | POA: Diagnosis not present

## 2020-07-28 DIAGNOSIS — L821 Other seborrheic keratosis: Secondary | ICD-10-CM | POA: Diagnosis not present

## 2020-07-28 DIAGNOSIS — D224 Melanocytic nevi of scalp and neck: Secondary | ICD-10-CM | POA: Diagnosis not present

## 2020-07-28 DIAGNOSIS — Z85820 Personal history of malignant melanoma of skin: Secondary | ICD-10-CM | POA: Diagnosis not present

## 2020-07-28 DIAGNOSIS — L814 Other melanin hyperpigmentation: Secondary | ICD-10-CM | POA: Diagnosis not present

## 2020-07-28 DIAGNOSIS — L570 Actinic keratosis: Secondary | ICD-10-CM | POA: Diagnosis not present

## 2020-07-28 DIAGNOSIS — D225 Melanocytic nevi of trunk: Secondary | ICD-10-CM | POA: Diagnosis not present

## 2020-07-28 DIAGNOSIS — Z85828 Personal history of other malignant neoplasm of skin: Secondary | ICD-10-CM | POA: Diagnosis not present

## 2020-07-28 DIAGNOSIS — C44719 Basal cell carcinoma of skin of left lower limb, including hip: Secondary | ICD-10-CM | POA: Diagnosis not present

## 2021-01-26 DIAGNOSIS — Z85828 Personal history of other malignant neoplasm of skin: Secondary | ICD-10-CM | POA: Diagnosis not present

## 2021-01-26 DIAGNOSIS — D485 Neoplasm of uncertain behavior of skin: Secondary | ICD-10-CM | POA: Diagnosis not present

## 2021-01-26 DIAGNOSIS — L821 Other seborrheic keratosis: Secondary | ICD-10-CM | POA: Diagnosis not present

## 2021-01-26 DIAGNOSIS — L570 Actinic keratosis: Secondary | ICD-10-CM | POA: Diagnosis not present

## 2021-01-26 DIAGNOSIS — D225 Melanocytic nevi of trunk: Secondary | ICD-10-CM | POA: Diagnosis not present

## 2021-01-26 DIAGNOSIS — L814 Other melanin hyperpigmentation: Secondary | ICD-10-CM | POA: Diagnosis not present

## 2021-01-26 DIAGNOSIS — Z85820 Personal history of malignant melanoma of skin: Secondary | ICD-10-CM | POA: Diagnosis not present

## 2021-07-27 DIAGNOSIS — Z85828 Personal history of other malignant neoplasm of skin: Secondary | ICD-10-CM | POA: Diagnosis not present

## 2021-07-27 DIAGNOSIS — L821 Other seborrheic keratosis: Secondary | ICD-10-CM | POA: Diagnosis not present

## 2021-07-27 DIAGNOSIS — L814 Other melanin hyperpigmentation: Secondary | ICD-10-CM | POA: Diagnosis not present

## 2021-07-27 DIAGNOSIS — Z85820 Personal history of malignant melanoma of skin: Secondary | ICD-10-CM | POA: Diagnosis not present

## 2021-07-27 DIAGNOSIS — L570 Actinic keratosis: Secondary | ICD-10-CM | POA: Diagnosis not present

## 2021-07-27 DIAGNOSIS — C44722 Squamous cell carcinoma of skin of right lower limb, including hip: Secondary | ICD-10-CM | POA: Diagnosis not present

## 2022-01-25 DIAGNOSIS — H61002 Unspecified perichondritis of left external ear: Secondary | ICD-10-CM | POA: Diagnosis not present

## 2022-01-25 DIAGNOSIS — L570 Actinic keratosis: Secondary | ICD-10-CM | POA: Diagnosis not present

## 2022-01-25 DIAGNOSIS — Z85828 Personal history of other malignant neoplasm of skin: Secondary | ICD-10-CM | POA: Diagnosis not present

## 2022-01-25 DIAGNOSIS — Z85820 Personal history of malignant melanoma of skin: Secondary | ICD-10-CM | POA: Diagnosis not present

## 2022-01-25 DIAGNOSIS — I8392 Asymptomatic varicose veins of left lower extremity: Secondary | ICD-10-CM | POA: Diagnosis not present

## 2022-01-25 DIAGNOSIS — D485 Neoplasm of uncertain behavior of skin: Secondary | ICD-10-CM | POA: Diagnosis not present

## 2022-01-25 DIAGNOSIS — L821 Other seborrheic keratosis: Secondary | ICD-10-CM | POA: Diagnosis not present

## 2022-08-09 DIAGNOSIS — Z85828 Personal history of other malignant neoplasm of skin: Secondary | ICD-10-CM | POA: Diagnosis not present

## 2022-08-09 DIAGNOSIS — D485 Neoplasm of uncertain behavior of skin: Secondary | ICD-10-CM | POA: Diagnosis not present

## 2022-08-09 DIAGNOSIS — D1801 Hemangioma of skin and subcutaneous tissue: Secondary | ICD-10-CM | POA: Diagnosis not present

## 2022-08-09 DIAGNOSIS — L821 Other seborrheic keratosis: Secondary | ICD-10-CM | POA: Diagnosis not present

## 2022-08-09 DIAGNOSIS — L570 Actinic keratosis: Secondary | ICD-10-CM | POA: Diagnosis not present

## 2022-08-09 DIAGNOSIS — C44729 Squamous cell carcinoma of skin of left lower limb, including hip: Secondary | ICD-10-CM | POA: Diagnosis not present

## 2022-08-09 DIAGNOSIS — L814 Other melanin hyperpigmentation: Secondary | ICD-10-CM | POA: Diagnosis not present

## 2022-08-26 ENCOUNTER — Other Ambulatory Visit: Payer: Self-pay

## 2022-08-26 ENCOUNTER — Observation Stay (HOSPITAL_BASED_OUTPATIENT_CLINIC_OR_DEPARTMENT_OTHER)
Admission: EM | Admit: 2022-08-26 | Discharge: 2022-08-27 | Disposition: A | Discharge status: Home / Self Care | Payer: PPO | Attending: Internal Medicine | Admitting: Internal Medicine

## 2022-08-26 ENCOUNTER — Encounter (HOSPITAL_BASED_OUTPATIENT_CLINIC_OR_DEPARTMENT_OTHER): Payer: Self-pay

## 2022-08-26 ENCOUNTER — Emergency Department (HOSPITAL_BASED_OUTPATIENT_CLINIC_OR_DEPARTMENT_OTHER): Payer: PPO

## 2022-08-26 DIAGNOSIS — G459 Transient cerebral ischemic attack, unspecified: Secondary | ICD-10-CM | POA: Diagnosis not present

## 2022-08-26 DIAGNOSIS — E785 Hyperlipidemia, unspecified: Secondary | ICD-10-CM | POA: Diagnosis not present

## 2022-08-26 DIAGNOSIS — Z79899 Other long term (current) drug therapy: Secondary | ICD-10-CM | POA: Insufficient documentation

## 2022-08-26 DIAGNOSIS — I671 Cerebral aneurysm, nonruptured: Secondary | ICD-10-CM

## 2022-08-26 DIAGNOSIS — R002 Palpitations: Secondary | ICD-10-CM

## 2022-08-26 DIAGNOSIS — R531 Weakness: Secondary | ICD-10-CM | POA: Diagnosis present

## 2022-08-26 DIAGNOSIS — R29818 Other symptoms and signs involving the nervous system: Secondary | ICD-10-CM | POA: Diagnosis not present

## 2022-08-26 LAB — CBC
HCT: 48.7 % (ref 39.0–52.0)
Hemoglobin: 16.5 g/dL (ref 13.0–17.0)
MCH: 28.4 pg (ref 26.0–34.0)
MCHC: 33.9 g/dL (ref 30.0–36.0)
MCV: 83.8 fL (ref 80.0–100.0)
Platelets: 212 10*3/uL (ref 150–400)
RBC: 5.81 MIL/uL (ref 4.22–5.81)
RDW: 13.1 % (ref 11.5–15.5)
WBC: 5.5 10*3/uL (ref 4.0–10.5)
nRBC: 0 % (ref 0.0–0.2)

## 2022-08-26 LAB — BASIC METABOLIC PANEL
Anion gap: 9 (ref 5–15)
BUN: 19 mg/dL (ref 8–23)
CO2: 27 mmol/L (ref 22–32)
Calcium: 9.4 mg/dL (ref 8.9–10.3)
Chloride: 101 mmol/L (ref 98–111)
Creatinine, Ser: 1.12 mg/dL (ref 0.61–1.24)
GFR, Estimated: >60 mL/min (ref >60)
Glucose, Bld: 109 mg/dL — ABNORMAL HIGH (ref 70–99)
Potassium: 4 mmol/L (ref 3.5–5.1)
Sodium: 137 mmol/L (ref 135–145)

## 2022-08-26 LAB — CBG MONITORING, ED: Glucose-Capillary: 107 mg/dL — ABNORMAL HIGH (ref 70–99)

## 2022-08-26 LAB — HEMOGLOBIN A1C
Hgb A1c MFr Bld: 5.5 % (ref 4.8–5.6)
Mean Plasma Glucose: 111.15 mg/dL

## 2022-08-26 LAB — HIV ANTIBODY (ROUTINE TESTING W REFLEX): HIV Screen 4th Generation wRfx: NONREACTIVE

## 2022-08-26 MED ORDER — STROKE: EARLY STAGES OF RECOVERY BOOK
Freq: Once | Status: AC
  Filled 2022-08-26: qty 1

## 2022-08-26 MED ORDER — ACETAMINOPHEN 650 MG RE SUPP: 650.0000 mg | RECTAL | Status: DC | PRN

## 2022-08-26 MED ORDER — ACETAMINOPHEN 325 MG PO TABS: 650.0000 mg | ORAL_TABLET | ORAL | Status: DC | PRN

## 2022-08-26 MED ORDER — ACETAMINOPHEN 160 MG/5ML PO SOLN: 650.0000 mg | ORAL | Status: DC | PRN

## 2022-08-26 NOTE — H&P (Signed)
History and Physical    Patient: Darren Hess [id number] DOB: [birth year] DOA: 08/26/2022 DOS: the patient was seen and examined on 08/26/2022 PCP: Patient, No Pcp Per  Patient coming from: Outside Hospital  Chief Complaint:  Chief Complaint  Patient presents with   Weakness    With left arm numbness   HPI: Darren Hess is a 67 y.o. male with medical history significant of  anxiety, bursitis, dizziness, pneumonia presented to [facility] with multiple episodes of left-sided weakness and numbness. His symptoms started yesterday where he had 2 episodes of left sided numbness and weakness resolving in 1-3 mins. He had another episode this morning at 8.40am when he was seeing his patient.  He reports tingling in the left lips, jaw and weakness and sensory loss in the arms (starting at the fingers) and moving towards the left leg. Again lasted 1-3 mins so called EMS and was admitted to [facility]. Now has a mild tension headache. Denies facial droop, slurring of speech headache, vision changes, confusion, vomiting, speech changes, seizures, ataxia or urinary/bowel incontinence.    Imaging CT head negative for acute stroke MRI pending  CT angio head and neck pending   ED course Vital signs on admission: BP 180/99, respiratory 18, heart rate 84, temp 98, sats 99% on room air. Labs: BMP unremarkable except glucose 109, CBC within normal limits.   Review of Systems: As mentioned in the history of present illness. All other systems reviewed and are negative. Past Medical History:  Diagnosis Date   Anxiety    Bursitis    right shoulder - cortisone shot by Dr. Rennis Chris   Dizziness 02/01/13   Little mild dizziness with no loss of coordination or inability to do things from it.    Elevated BP 02/01/13   Elevated over past few weeks   Pneumonia 2012   right middle lobe   Skin flushed 02/01/13   Sleep deprivation 02/01/13   Under a lot of stress about son who has major liver problems   Sleep  difficulties 02/01/13   Under a lot of stress about son who has major liver problems   Past Surgical History:  Procedure Laterality Date   APPENDECTOMY     Social History:  reports that he has never smoked. He does not have any smokeless tobacco history on file. He reports current alcohol use. He reports that he does not use drugs.  No Known Allergies  Family History  Problem Relation Age of Onset   CAD Father    CAD Paternal Grandfather    CAD Paternal Uncle    Cancer Mother        age 53    Prior to Admission medications   Not on File    Physical Exam: Vitals:   08/26/22 1600 08/26/22 1700 08/26/22 1715 08/26/22 1831  BP: (!) 144/103 130/79 (!) 139/99 (!) 153/97  Pulse: 77 84 87 84  Resp: 14 19 14 15   Temp:    98.1 F (36.7 C)  TempSrc:    Oral  SpO2: 96% 99% 99% 98%  Weight:      Height:       General: Alert, no acute distress, well appearing Cardio: Normal S1 and S2, RRR, no r/m/g Pulm: CTAB, normal work of breathing Abdomen: Bowel sounds normal. Abdomen soft and non-tender.  Extremities: No peripheral edema.  Neuro: Cranial nerves grossly intact, 5/5 strength UE and Les, normal sensation throughout extremeties, no facial droop or slurring of speech   Data  Reviewed:  There are no new results to review at this time.  Assessment and Plan:  Tia Vs Stroke  Multiple episodes of left hemiparesis and hemisensory loss in the last 24 hrs which have now resolved. Admitting for further neuro work up. -Admit to medical telemetry- observation  -Vitals per floor protocol -Up with assistance  -MRI brain w wo contrast -CT angio head and neck  -Neuro consult -Monitor on telemetry -Frequent neuro checks -Echo -Permissive hypertension x24 hours -Risk strat labs: hgb A1C, lipid, TSH -PT/OT/SLP -Ppx -SCDs    PVCs Pt showed me PVCs on EKG provided by EMS today. He reports 3-4 month hx of "flutters" in the chest. Denies chest pain, dyspnea, dizziness with these  episodes -Repeat EKG -Monitor on telemetry    Advance Care Planning:   Code Status: Full Code   Consults: Neuro  Family Communication: Family updated at bedside  Severity of Illness: The appropriate patient status for this patient is OBSERVATION. Observation status is judged to be reasonable and necessary in order to provide the required intensity of service to ensure the patient's safety. The patient's presenting symptoms, physical exam findings, and initial radiographic and laboratory data in the context of their medical condition is felt to place them at decreased risk for further clinical deterioration. Furthermore, it is anticipated that the patient will be medically stable for discharge from the hospital within 2 midnights of admission.   Author: Rolm Gala, MD 08/26/2022 6:36 PM  For on call review [email or web address].

## 2022-08-26 NOTE — ED Provider Notes (Signed)
Darren Hess EMERGENCY DEPARTMENT AT [facility] Provider Note   CSN: [id number] Arrival date & time: 08/26/22  1001     History {Add pertinent medical, surgical, social history, OB history to HPI:1} Chief Complaint  Patient presents with   Weakness    With left arm numbness    Darren Hess is a 67 y.o. male.  Patient is a healthy 67 year old man presenting with left sided weakness and numbness. Patient reports for the past two days he has been experiencing intermittent numbness and tingling in his left lip, arm, and leg. He also reports periods of weakness in his arm and leg, which he began to notice in the last 24 hours. Denies any loss of consciousness or falls, no fevers or systemic symptoms.    Weakness Associated symptoms: no chest pain, no fever and no shortness of breath        Home Medications Prior to Admission medications   Not on File      Allergies    Patient has no known allergies.    Review of Systems   Review of Systems  Constitutional:  Negative for fever.  Respiratory:  Negative for shortness of breath.   Cardiovascular:  Negative for chest pain.  Neurological:  Positive for weakness.    Physical Exam Updated Vital Signs BP (!) 180/99 (BP Location: Right Arm)   Pulse 84   Temp 98.4 F (36.9 C)   Resp 18   Ht 5\' 10"  (1.778 m)   Wt 78.5 kg   SpO2 99%   BMI 24.82 kg/m  Physical Exam Constitutional:      General: He is not in acute distress. Cardiovascular:     Rate and Rhythm: Normal rate and regular rhythm.     Heart sounds: Normal heart sounds.  Pulmonary:     Effort: Pulmonary effort is normal. No respiratory distress.     Breath sounds: Normal breath sounds.  Abdominal:     General: There is no distension.     Palpations: Abdomen is soft.     Tenderness: There is no abdominal tenderness.  Neurological:     Mental Status: He is alert.     Comments: CN 2-12 intact. Symmetric strength on bilateral upper and lower  extremities. No significant numbness noted.      ED Results / Procedures / Treatments   Labs (all labs ordered are listed, but only abnormal results are displayed) Labs Reviewed  BASIC METABOLIC PANEL  CBC  CBG MONITORING, ED    EKG None  Radiology No results found.  Procedures Procedures  {Document cardiac monitor, telemetry assessment procedure when appropriate:1}  Medications Ordered in ED Medications - No data to display  ED Course/ Medical Decision Making/ A&P   {   Click here for ABCD2, HEART and other calculatorsREFRESH Note before signing :1}                          Medical Decision Making Amount and/or Complexity of Data Reviewed Labs: ordered. Radiology: ordered.  Medical Decision Making:   Darren Hess is a 67 y.o. male who presented to the ED today with weakness detailed above.    Complete initial physical exam performed, notably the patient  was alert in no acute distress.    Reviewed and confirmed nursing documentation for past medical history, family history, social history.    Initial Assessment:   With the patient's presentation of weakness, most likely diagnosis is  TIA. Other diagnoses were considered including (but not limited to) stroke, syncope, ACS. These are considered less likely due to history of present illness and physical exam findings.   This is most consistent with an acute complicated illness  Initial Plan:   Screening labs including CBC and Metabolic panel to evaluate for infectious or metabolic etiology of disease.  Head CT to evaluate for intracranial pathology Objective evaluation as below reviewed   Initial Study Results:   Laboratory  All laboratory results reviewed without evidence of clinically relevant pathology.     EKG EKG was reviewed independently. Rate, rhythm, axis, intervals all examined and without medically relevant abnormality. ST segments without concerns for elevations.    Radiology:  All images  reviewed independently. Agree with radiology report at this time.   No results found.  Consults: spoke with neurology on call who want to admit for TIA workup  Reassessment and Plan:   Patient is a previously healthy 67 year old gentleman presenting with left sided weakness and numbness. At the time of presentation to the ED, patient was hypertension with BP 180/99. Physical exam showed symmetric strength with some residual numbness especially in facial area. Labs were reassuring. CT head showed no acute abnormality. On reassessment, patient was resting comfortably in bed. Neurology was consulted and advised admission for further TIA workup. Plan was discussed with patient who agreed with medical decision making.    {Document critical care time when appropriate:1} {Document review of labs and clinical decision tools ie heart score, Chads2Vasc2 etc:1}  {Document your independent review of radiology images, and any outside records:1} {Document your discussion with family members, caretakers, and with consultants:1} {Document social determinants of health affecting pt's care:1} {Document your decision making why or why not admission, treatments were needed:1} Final Clinical Impression(s) / ED Diagnoses Final diagnoses:  None    Rx / DC Orders ED Discharge Orders     None

## 2022-08-26 NOTE — ED Notes (Signed)
[facility] at bedside 

## 2022-08-26 NOTE — Consult Note (Signed)
Neurology Consultation Reason for Consult: TIA Requesting Physician: Wayne Both  CC: left sided numbness  History is obtained from: Patient  HPI: Darren Hess is a 67 y.o. male with a past medical history significant for left lower extremity melanoma, no other significant health issues  He notes that on Sunday at about 1 PM he had a brief episode of numbness lasting 1 to 2 seconds while outside walking from his car to throw away some trash.  He had a second episode at around 3 PM at home.  He attributed these to perhaps not eating as much is normal that day and drinking perhaps more coffee than normal.  However he did note his blood pressure was elevated to 175/95 with the second episode.  On Monday he had 2 or 3 additional episodes each lasting approximately 1 to 2 minutes and after the third episode decided to come to the ED for further evaluation.  He perhaps had some slight left leg weakness with 1 of these episodes but otherwise denies any neurologic or systemic symptoms on full review of systems  He reports he is in very good health generally, but has not had a recent cholesterol check  LKW: (N/A, back to baseline) Thrombolytic given?: No, too mild to treat IA performed?: No, exam not c/w LVO Premorbid modified rankin scale:      0 - No symptoms.  ROS: All other review of systems was negative except as noted in the HPI.  Past Medical History:  Diagnosis Date   Anxiety    Bursitis    right shoulder - cortisone shot by Dr. Rennis Chris   Dizziness 02/01/13   Little mild dizziness with no loss of coordination or inability to do things from it.    Elevated BP 02/01/13   Elevated over past few weeks   Pneumonia 2012   right middle lobe   Skin flushed 02/01/13   Sleep deprivation 02/01/13   Under a lot of stress about son who has major liver problems   Sleep difficulties 02/01/13   Under a lot of stress about son who has major liver problems   Past Surgical History:   Procedure Laterality Date   APPENDECTOMY       Family History  Problem Relation Age of Onset   CAD Father    CAD Paternal Grandfather    CAD Paternal Uncle    Cancer Mother        age 59    Social History:  reports that he has never smoked. He does not have any smokeless tobacco history on file. He reports current alcohol use. He reports that he does not use drugs.  Exam: Current vital signs: BP (!) 173/81 (BP Location: Right Arm)   Pulse 89   Temp 98.3 F (36.8 C) (Oral)   Resp 18   Ht 5\' 10"  (1.778 m)   Wt 78.5 kg   SpO2 98%   BMI 24.82 kg/m  Vital signs in last 24 hours: Temp:  [98 F (36.7 C)-98.4 F (36.9 C)] 98.3 F (36.8 C) (07/15 1933) Pulse Rate:  [75-89] 89 (07/15 1933) Resp:  [14-20] 18 (07/15 1933) BP: (130-180)/(79-103) 173/81 (07/15 1933) SpO2:  [95 %-100 %] 98 % (07/15 1933) Weight:  [78.5 kg] 78.5 kg (07/15 1024)   Physical Exam  Constitutional: Appears well-developed and well-nourished.  Psych: Affect appropriate to situation, pleasant and cooperative Eyes: No scleral injection HENT: No oropharyngeal obstruction.  MSK: no joint deformities.  Cardiovascular: Perfusing extremities well Respiratory:  Effort normal, non-labored breathing GI: Soft.  No distension. There is no tenderness.  Skin: Warm dry and intact visible skin  Neuro: Mental Status: Patient is awake, alert, oriented to person, place, month, year, and situation. Patient is able to give a clear and coherent history. No signs of aphasia or neglect Cranial Nerves: II: Visual Fields are full. Pupils are equal, round, and reactive to light.   III,IV, VI: EOMI without ptosis or diploplia.  Smooth pursuits V: Facial sensation is symmetric to temperature VII: Facial movement is symmetric.  VIII: hearing is intact to voice X: Uvula elevates symmetrically XI: Shoulder shrug is symmetric. XII: tongue is midline without atrophy or fasciculations.  Motor: Tone is normal. Bulk is  normal. 5/5 strength was present in all four extremities.  Sensory: Sensation is symmetric to light touch and temperature in the arms and legs. Deep Tendon Reflexes: 2+ and symmetric in the brachioradialis and patellae.  Cerebellar: FNF and HKS are intact bilaterally Gait:  Deferred in acute setting   NIHSS total 0  I have reviewed labs in epic and the results pertinent to this consultation are:  Basic Metabolic Panel: Recent Labs  Lab 08/26/22 1040  NA 137  K 4.0  CL 101  CO2 27  GLUCOSE 109*  BUN 19  CREATININE 1.12  CALCIUM 9.4    CBC: Recent Labs  Lab 08/26/22 1040  WBC 5.5  HGB 16.5  HCT 48.7  MCV 83.8  PLT 212    Coagulation Studies: No results for input(s): "LABPROT", "INR" in the last 72 hours.    I have reviewed the images obtained:  Head CT personally reviewed, agree with radiology no acute intracranial process   Impression: Symptoms as described in this 67 year old patient most consistent with stuttering lacunar TIA  Recommendations: # Likely right sided subcortical TIA versus small stroke - Stroke labs HgbA1c, fasting lipid panel - MRI brain  - CTA head and neck - Frequent neuro checks per protocol - Echocardiogram - Prophylactic therapy-Antiplatelet med: Aspirin - dose 325mg  PO or 300mg  PR, followed by 81 mg daily - Plavix 300 mg load with 75 mg daily for 21 - 90 day course  - Atorvastatin 40 mg nightly for LDL goal less than 70 - Risk factor modification - Telemetry monitoring;  - Blood pressure goal   - Permissive hypertension to 220/120 until stroke ruled out by MRI or for 24 to 48 hours - PT consult, OT consult, Speech consult not indicated at this time as patient is at his baseline - Stroke team to follow  Brooke Dare MD-PhD [facility] [phone] Available 7 PM to 7 AM, outside of these hours please call Neurologist on call as listed on Amion.

## 2022-08-26 NOTE — ED Triage Notes (Signed)
Onset yesterday of weakness associated with left arm and lip numbness. Generally not feeling good.  No pain or SOB.

## 2022-08-27 ENCOUNTER — Encounter: Payer: Self-pay | Admitting: *Deleted

## 2022-08-27 ENCOUNTER — Observation Stay (HOSPITAL_COMMUNITY): Payer: PPO

## 2022-08-27 ENCOUNTER — Other Ambulatory Visit: Payer: Self-pay | Admitting: Physician Assistant

## 2022-08-27 DIAGNOSIS — R29818 Other symptoms and signs involving the nervous system: Secondary | ICD-10-CM | POA: Diagnosis not present

## 2022-08-27 DIAGNOSIS — E782 Mixed hyperlipidemia: Secondary | ICD-10-CM

## 2022-08-27 DIAGNOSIS — E785 Hyperlipidemia, unspecified: Secondary | ICD-10-CM

## 2022-08-27 DIAGNOSIS — I6523 Occlusion and stenosis of bilateral carotid arteries: Secondary | ICD-10-CM | POA: Diagnosis not present

## 2022-08-27 DIAGNOSIS — G459 Transient cerebral ischemic attack, unspecified: Secondary | ICD-10-CM | POA: Diagnosis not present

## 2022-08-27 DIAGNOSIS — I671 Cerebral aneurysm, nonruptured: Secondary | ICD-10-CM | POA: Diagnosis not present

## 2022-08-27 DIAGNOSIS — R002 Palpitations: Secondary | ICD-10-CM

## 2022-08-27 DIAGNOSIS — I6782 Cerebral ischemia: Secondary | ICD-10-CM | POA: Diagnosis not present

## 2022-08-27 LAB — COMPREHENSIVE METABOLIC PANEL
ALT: 16 U/L (ref 0–44)
AST: 15 U/L (ref 15–41)
Albumin: 3.4 g/dL — ABNORMAL LOW (ref 3.5–5.0)
Alkaline Phosphatase: 39 U/L (ref 38–126)
Anion gap: 8 (ref 5–15)
BUN: 16 mg/dL (ref 8–23)
CO2: 26 mmol/L (ref 22–32)
Calcium: 8.9 mg/dL (ref 8.9–10.3)
Chloride: 102 mmol/L (ref 98–111)
Creatinine, Ser: 1.09 mg/dL (ref 0.61–1.24)
GFR, Estimated: >60 mL/min (ref >60)
Glucose, Bld: 100 mg/dL — ABNORMAL HIGH (ref 70–99)
Potassium: 3.7 mmol/L (ref 3.5–5.1)
Sodium: 136 mmol/L (ref 135–145)
Total Bilirubin: 1.2 mg/dL (ref 0.3–1.2)
Total Protein: 5.9 g/dL — ABNORMAL LOW (ref 6.5–8.1)

## 2022-08-27 LAB — LIPID PANEL
Cholesterol: 209 mg/dL — ABNORMAL HIGH (ref 0–200)
HDL: 58 mg/dL (ref >40)
LDL Cholesterol: 130 mg/dL — ABNORMAL HIGH (ref 0–99)
Total CHOL/HDL Ratio: 3.6 RATIO
Triglycerides: 104 mg/dL (ref <150)
VLDL: 21 mg/dL (ref 0–40)

## 2022-08-27 LAB — ECHOCARDIOGRAM COMPLETE
Area-P 1/2: 2.81 cm2
Calc EF: 60.2 %
Height: 70 in
S' Lateral: 3.8 cm
Single Plane A2C EF: 60.3 %
Single Plane A4C EF: 57 %
Weight: 2768 oz

## 2022-08-27 LAB — CBC
HCT: 45.9 % (ref 39.0–52.0)
Hemoglobin: 15.4 g/dL (ref 13.0–17.0)
MCH: 28.6 pg (ref 26.0–34.0)
MCHC: 33.6 g/dL (ref 30.0–36.0)
MCV: 85.3 fL (ref 80.0–100.0)
Platelets: 188 10*3/uL (ref 150–400)
RBC: 5.38 MIL/uL (ref 4.22–5.81)
RDW: 13.2 % (ref 11.5–15.5)
WBC: 4.7 10*3/uL (ref 4.0–10.5)
nRBC: 0 % (ref 0.0–0.2)

## 2022-08-27 LAB — TSH: TSH: 5.141 u[IU]/mL — ABNORMAL HIGH (ref 0.350–4.500)

## 2022-08-27 MED ORDER — PERFLUTREN LIPID MICROSPHERE
1.0000 mL | INTRAVENOUS | Status: AC | PRN
  Administered 2022-08-27: 2 mL via INTRAVENOUS

## 2022-08-27 MED ORDER — CLOPIDOGREL BISULFATE 75 MG PO TABS
300.0000 mg | ORAL_TABLET | Freq: Once | ORAL | Status: AC
  Administered 2022-08-27: 300 mg via ORAL
  Filled 2022-08-27: qty 4

## 2022-08-27 MED ORDER — CLOPIDOGREL BISULFATE 75 MG PO TABS: 75.0000 mg | ORAL_TABLET | Freq: Every day | ORAL | Status: DC

## 2022-08-27 MED ORDER — ASPIRIN 325 MG PO TABS
325.0000 mg | ORAL_TABLET | Freq: Once | ORAL | Status: AC
  Administered 2022-08-27: 325 mg via ORAL
  Filled 2022-08-27: qty 1

## 2022-08-27 MED ORDER — ATORVASTATIN CALCIUM 40 MG PO TABS: 40.0000 mg | ORAL_TABLET | Freq: Every day | ORAL | 0 refills | Status: DC

## 2022-08-27 MED ORDER — ATORVASTATIN CALCIUM 40 MG PO TABS: 40.0000 mg | ORAL_TABLET | Freq: Every day | ORAL | Status: DC

## 2022-08-27 MED ORDER — CLOPIDOGREL BISULFATE 75 MG PO TABS: 75.0000 mg | ORAL_TABLET | Freq: Every day | ORAL | 0 refills | Status: AC

## 2022-08-27 MED ORDER — ASPIRIN 81 MG PO TBEC: 81.0000 mg | DELAYED_RELEASE_TABLET | Freq: Every day | ORAL | Status: DC

## 2022-08-27 MED ORDER — IOHEXOL 350 MG/ML SOLN
75.0000 mL | Freq: Once | INTRAVENOUS | Status: AC | PRN
  Administered 2022-08-27: 75 mL via INTRAVENOUS

## 2022-08-27 MED ORDER — ASPIRIN 81 MG PO TBEC: 81.0000 mg | DELAYED_RELEASE_TABLET | Freq: Every day | ORAL | 0 refills | Status: AC

## 2022-08-27 NOTE — Plan of Care (Signed)
  Problem: Education: Goal: Knowledge of General Education information will improve Description: Including pain rating scale, medication(s)/side effects and non-pharmacologic comfort measures Outcome: Adequate for Discharge   Problem: Health Behavior/Discharge Planning: Goal: Ability to manage health-related needs will improve Outcome: Adequate for Discharge   Problem: Clinical Measurements: Goal: Ability to maintain clinical measurements within normal limits will improve Outcome: Adequate for Discharge Goal: Will remain free from infection Outcome: Adequate for Discharge Goal: Diagnostic test results will improve Outcome: Adequate for Discharge Goal: Respiratory complications will improve Outcome: Adequate for Discharge Goal: Cardiovascular complication will be avoided Outcome: Adequate for Discharge   Problem: Activity: Goal: Risk for activity intolerance will decrease Outcome: Adequate for Discharge   Problem: Nutrition: Goal: Adequate nutrition will be maintained Outcome: Adequate for Discharge   Problem: Coping: Goal: Level of anxiety will decrease Outcome: Adequate for Discharge   Problem: Elimination: Goal: Will not experience complications related to bowel motility Outcome: Adequate for Discharge Goal: Will not experience complications related to urinary retention Outcome: Adequate for Discharge   Problem: Pain Managment: Goal: General experience of comfort will improve Outcome: Adequate for Discharge   Problem: Safety: Goal: Ability to remain free from injury will improve Outcome: Adequate for Discharge   Problem: Skin Integrity: Goal: Risk for impaired skin integrity will decrease Outcome: Adequate for Discharge   Problem: Education: Goal: Knowledge of disease or condition will improve Outcome: Adequate for Discharge Goal: Knowledge of secondary prevention will improve (MUST DOCUMENT ALL) Outcome: Adequate for Discharge Goal: Knowledge of patient  specific risk factors will improve (Mark N/A or DELETE if not current risk factor) Outcome: Adequate for Discharge   Problem: Ischemic Stroke/TIA Tissue Perfusion: Goal: Complications of ischemic stroke/TIA will be minimized Outcome: Adequate for Discharge   Problem: Coping: Goal: Will verbalize positive feelings about self Outcome: Adequate for Discharge Goal: Will identify appropriate support needs Outcome: Adequate for Discharge   Problem: Health Behavior/Discharge Planning: Goal: Ability to manage health-related needs will improve Outcome: Adequate for Discharge Goal: Goals will be collaboratively established with patient/family Outcome: Adequate for Discharge   Problem: Self-Care: Goal: Ability to participate in self-care as condition permits will improve Outcome: Adequate for Discharge Goal: Verbalization of feelings and concerns over difficulty with self-care will improve Outcome: Adequate for Discharge Goal: Ability to communicate needs accurately will improve Outcome: Adequate for Discharge   Problem: Nutrition: Goal: Risk of aspiration will decrease Outcome: Adequate for Discharge Goal: Dietary intake will improve Outcome: Adequate for Discharge   

## 2022-08-27 NOTE — Progress Notes (Signed)
30 day monitor for stroke  Dr. Carolan Clines to read

## 2022-08-27 NOTE — Assessment & Plan Note (Signed)
Pt to f/u with outpatient neurointerventional radiology for consult.

## 2022-08-27 NOTE — Progress Notes (Signed)
PROGRESS NOTE    Darren Hess  WGN:[id number] DOB: 11-06-1955 DOA: 08/26/2022 PCP: Patient, No Pcp Per  Subjective: Admitted for lip numbness and weakness left arm and TIA workup. Pt still working as a Education officer, community.   Hospital Course: HPI: Darren Hess is a 67 y.o. male with medical history significant of  anxiety, bursitis, dizziness, pneumonia presented to [facility] with multiple episodes of left-sided weakness and numbness. His symptoms started yesterday where he had 2 episodes of left sided numbness and weakness resolving in 1-3 mins. He had another episode this morning at 8.40am when he was seeing his patient.  He reports tingling in the left lips, jaw and weakness and sensory loss in the arms (starting at the fingers) and moving towards the left leg. Again lasted 1-3 mins so called EMS and was admitted to [facility]. Now has a mild tension headache. Denies facial droop, slurring of speech headache, vision changes, confusion, vomiting, speech changes, seizures, ataxia or urinary/bowel incontinence.    Imaging CT head negative for acute stroke MRI pending  CT angio head and neck pending    ED course Vital signs on admission: BP 180/99, respiratory 18, heart rate 84, temp 98, sats 99% on room air. Labs: BMP unremarkable except glucose 109, CBC within normal limits.   Significant Events: Admitted 08/26/2022   Significant Labs: TC 209, LCL 130, HDL 58, TG 104  Significant Imaging Studies: CT head negative for acute abnormality MRI brain negative for acute CVA  Antibiotic Therapy:   Procedures:   Consultants: neurology    Assessment and Plan: * TIA (transient ischemic attack) Admitted for TIA. Seen by neurology. Started on plavix, asa. CT head negative. MRI brain negative.  Palpitation Wife states pt has reported palpitations at home recently. Will ask cardiology for Ziopatch. Epic message sent to cardiology.  Hyperlipidemia Start lipitor 40 mg at  bedtime.       DVT prophylaxis: SCD's Start: 08/26/22 1833   Code Status: Full Code Family Communication: discussed with wife and patient Disposition Plan: return home Reason for continuing need for hospitalization: ready for discharge.  Objective: Vitals:   08/26/22 1831 08/26/22 1933 08/26/22 2310 08/27/22 0316  BP: (!) 153/97 (!) 173/81 115/67 130/81  Pulse: 84 89 73 65  Resp: 15 18 18 18   Temp: 98.1 F (36.7 C) 98.3 F (36.8 C) 97.8 F (36.6 C) 97.7 F (36.5 C)  TempSrc: Oral Oral Oral Oral  SpO2: 98% 98% 98% 100%  Weight:      Height:       No intake or output data in the 24 hours ending 08/27/22 1007 Filed Weights   08/26/22 1024  Weight: 78.5 kg    Examination:  Physical Exam Vitals and nursing note reviewed.  Constitutional:      General: He is not in acute distress. Skin:    Capillary Refill: Capillary refill takes less than 2 seconds.  Neurological:     General: No focal deficit present.     Mental Status: He is alert and oriented to person, place, and time.     Data Reviewed: I have personally reviewed following labs and imaging studies  CBC: Recent Labs  Lab 08/26/22 1040 08/27/22 0400  WBC 5.5 4.7  HGB 16.5 15.4  HCT 48.7 45.9  MCV 83.8 85.3  PLT 212 188   Basic Metabolic Panel: Recent Labs  Lab 08/26/22 1040 08/27/22 0400  NA 137 136  K 4.0 3.7  CL 101 102  CO2 27 26  GLUCOSE 109* 100*  BUN 19 16  CREATININE 1.12 1.09  CALCIUM 9.4 8.9   GFR: Estimated Creatinine Clearance: 67.9 mL/min (by C-G formula based on SCr of 1.09 mg/dL). Liver Function Tests: Recent Labs  Lab 08/27/22 0400  AST 15  ALT 16  ALKPHOS 39  BILITOT 1.2  PROT 5.9*  ALBUMIN 3.4*   No results for input(s): "LIPASE", "AMYLASE" in the last 168 hours. No results for input(s): "AMMONIA" in the last 168 hours. Coagulation Profile: No results for input(s): "INR", "PROTIME" in the last 168 hours. Cardiac Enzymes: No results for input(s): "CKTOTAL",  "CKMB", "CKMBINDEX", "TROPONINI" in the last 168 hours. BNP (last 3 results) No results for input(s): "PROBNP" in the last 8760 hours. HbA1C: Recent Labs    08/26/22 1853  HGBA1C 5.5   CBG: Recent Labs  Lab 08/26/22 1036  GLUCAP 107*   Lipid Profile: Recent Labs    08/27/22 0400  CHOL 209*  HDL 58  LDLCALC 130*  TRIG 104  CHOLHDL 3.6   Thyroid Function Tests: Recent Labs    08/27/22 0400  TSH 5.141*   Anemia Panel: No results for input(s): "VITAMINB12", "FOLATE", "FERRITIN", "TIBC", "IRON", "RETICCTPCT" in the last 72 hours. Sepsis Labs: No results for input(s): "PROCALCITON", "LATICACIDVEN" in the last 168 hours.  No results found for this or any previous visit (from the past 240 hour(s)).   Radiology Studies: MR BRAIN WO CONTRAST  Result Date: 08/27/2022 CLINICAL DATA:  Neuro deficit with acute stroke suspected. EXAM: MRI HEAD WITHOUT CONTRAST TECHNIQUE: Multiplanar, multiecho pulse sequences of the brain and surrounding structures were obtained without intravenous contrast. COMPARISON:  Head CT from earlier the same day FINDINGS: Brain: No acute infarction, hemorrhage, hydrocephalus, extra-axial collection or mass lesion. FLAIR hyperintensities scattered in the cerebral white matter attributed to chronic small vessel ischemia, mild for age. Vascular: Normal flow voids. Skull and upper cervical spine: Normal marrow signal. Sinuses/Orbits: Negative. IMPRESSION: 1. No acute or reversible finding. 2. Mild chronic small vessel ischemia. Electronically Signed   By: Tiburcio Pea M.D.   On: 08/27/2022 09:42   CT Head Wo Contrast  Result Date: 08/26/2022 CLINICAL DATA:  Neuro deficit, acute, stroke suspected. EXAM: CT HEAD WITHOUT CONTRAST TECHNIQUE: Contiguous axial images were obtained from the base of the skull through the vertex without intravenous contrast. RADIATION DOSE REDUCTION: This exam was performed according to the departmental dose-optimization program which  includes automated exposure control, adjustment of the mA and/or kV according to patient size and/or use of iterative reconstruction technique. COMPARISON:  None Available. FINDINGS: Brain: No evidence of acute infarction, hemorrhage, hydrocephalus, extra-axial collection or mass lesion/mass effect. Vascular: No hyperdense vessel identified. Skull: No acute fracture. Sinuses/Orbits: Clear sinuses.  No acute orbital findings. Other: No mastoid effusions. IMPRESSION: No evidence of acute intracranial abnormality. Electronically Signed   By: Feliberto Harts M.D.   On: 08/26/2022 12:05    Scheduled Meds:   stroke: early stages of recovery book   Does not apply Once   [START ON 08/28/2022] aspirin EC  81 mg Oral Daily   atorvastatin  40 mg Oral QHS   [START ON 08/28/2022] clopidogrel  75 mg Oral Daily   Continuous Infusions:   LOS: 0 days   Time spent: 40 minutes  Carollee Herter, DO  [facility]  08/27/2022, 10:07 AM

## 2022-08-27 NOTE — Assessment & Plan Note (Addendum)
Admitted for TIA. Seen by neurology. Started on plavix, asa and lipitor. CT head negative. MRI brain negative. Echo negative for shunt. 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 Pt will f/u with outpatient neurointerventional radiology for consult.

## 2022-08-27 NOTE — TOC Transition Note (Signed)
Transition of Care [facility]) - CM/SW Discharge Note   Patient Details  Name: Darren Hess MRN: [id number] Date of Birth: [birth year]  [facility] of Care [facility]) CM/SW Contact:  Kermit Balo, RN Phone Number: 08/27/2022, 3:47 PM   Clinical Narrative:    Pt is discharging home with self care. No needs per TOC.   Final next level of care: Home/Self Care Barriers to Discharge: No Barriers Identified   Patient Goals and CMS Choice      Discharge Placement                         Discharge Plan and Services Additional resources added to the After Visit Summary for                                       Social Determinants of Health (SDOH) Interventions SDOH Screenings   Food Insecurity: No Food Insecurity (08/26/2022)  Housing: Low Risk  (08/26/2022)  Transportation Needs: No Transportation Needs (08/26/2022)  Utilities: Not At Risk (08/26/2022)  Tobacco Use: Unknown (08/26/2022)     Readmission Risk Interventions     No data to display

## 2022-08-27 NOTE — Discharge Summary (Signed)
Physician Discharge Summary   Patient name: Darren Hess  Admit date:     08/26/2022  Discharge date: 08/27/2022  Attending Physician: Noralee Stain [[id number]]  Discharge Physician: Carollee Herter   PCP: Creola Corn, MD  Admitted From: Home  Disposition:  Home  Recommendations for Outpatient Follow-up:  Follow up with PCP in 1-2 weeks F/U with neurointerventional radiology for outpatient appoint F/u cardiology for 30 day monitor  Home Health:No Equipment/Devices: None  Discharge Condition:Stable CODE STATUS:FULL Diet recommendation: Heart Healthy Fluid Restriction: None  Brief/Interim Summary: HPI: Darren Hess is a 67 y.o. male with medical history significant of  anxiety, bursitis, dizziness, pneumonia presented to [facility] with multiple episodes of left-sided weakness and numbness. His symptoms started yesterday where he had 2 episodes of left sided numbness and weakness resolving in 1-3 mins. He had another episode this morning at 8.40am when he was seeing his patient.  He reports tingling in the left lips, jaw and weakness and sensory loss in the arms (starting at the fingers) and moving towards the left leg. Again lasted 1-3 mins so called EMS and was admitted to [facility]. Now has a mild tension headache. Denies facial droop, slurring of speech headache, vision changes, confusion, vomiting, speech changes, seizures, ataxia or urinary/bowel incontinence.    Imaging CT head negative for acute stroke MRI pending  CT angio head and neck pending    ED course Vital signs on admission: BP 180/99, respiratory 18, heart rate 84, temp 98, sats 99% on room air. Labs: BMP unremarkable except glucose 109, CBC within normal limits.   Significant Events: Admitted 08/26/2022   Significant Labs: TC 209, LCL 130, HDL 58, TG 104  Significant Imaging Studies: CT head negative for acute abnormality MRI brain negative for acute CVA CTA negative for LVO. 3 mm Right side PCOM aneurysm   Echo showed normal LVEF. No shunt  Antibiotic Therapy:   Procedures:   Consultants: neurology   Assessment and Plan: * TIA (transient ischemic attack) Admitted for TIA. Seen by neurology. Started on plavix, asa and lipitor. CT head negative. MRI brain negative. Echo negative for shunt. 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 Pt will f/u with outpatient neurointerventional radiology for consult.  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  Palpitation Wife states pt has reported palpitations at home recently. Will ask cardiology for Ziopatch. Epic message sent to cardiology.  Hyperlipidemia Start lipitor 40 mg at bedtime.    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   Hyperlipidemia   Palpitation   Discharge Instructions  Discharge Instructions     Call MD for:  difficulty breathing, headache or visual disturbances   Complete by: As directed    Call MD for:  extreme fatigue   Complete by: As directed    Call MD for:  persistant dizziness or light-headedness   Complete by: As directed    Call MD for:  persistant nausea and vomiting   Complete by: As directed    Call MD for:  redness, tenderness, or signs of infection (pain, swelling, redness, odor or green/yellow discharge around incision site)   Complete by: As directed    Call MD for:  severe uncontrolled pain   Complete by: As directed    Call MD for:  temperature >100.4   Complete by: As directed  Diet - low sodium heart healthy   Complete by: As directed    Discharge instructions   Complete by: As directed    1. Followup with Dr. Timothy Lasso as scheduled 2. Cardiology office should call you to schedule cardiac monitor 3. Call Neurointerventional Radiology office at [phone] to schedule an appointment with Dr.  Corliss Skains.   Increase activity slowly   Complete by: As directed       Allergies as of 08/27/2022   No Known Allergies      Medication List     TAKE these medications    aspirin EC 81 MG tablet Take 1 tablet (81 mg total) by mouth daily. Swallow whole. Start taking on: August 28, 2022   atorvastatin 40 MG tablet Commonly known as: LIPITOR Take 1 tablet (40 mg total) by mouth at bedtime.   clopidogrel 75 MG tablet Commonly known as: PLAVIX Take 1 tablet (75 mg total) by mouth daily. Start taking on: August 28, 2022         No Known Allergies  Discharge Exam: Vitals:   08/27/22 1012 08/27/22 1341  BP: (!) 147/83 (!) 167/91  Pulse: 82 84  Resp: 18 20  Temp: 98.1 F (36.7 C) 98.2 F (36.8 C)  SpO2: 98% 100%   Vitals:   08/26/22 2310 08/27/22 0316 08/27/22 1012 08/27/22 1341  BP: 115/67 130/81 (!) 147/83 (!) 167/91  Pulse: 73 65 82 84  Resp: 18 18 18 20   Temp: 97.8 F (36.6 C) 97.7 F (36.5 C) 98.1 F (36.7 C) 98.2 F (36.8 C)  TempSrc: Oral Oral Oral Oral  SpO2: 98% 100% 98% 100%  Weight:      Height:        Physical Exam Vitals and nursing note reviewed.  HENT:     Head: Normocephalic and atraumatic.  Eyes:     General: No scleral icterus. Pulmonary:     Effort: Pulmonary effort is normal. No respiratory distress.  Skin:    Capillary Refill: Capillary refill takes less than 2 seconds.  Neurological:     General: No focal deficit present.     Mental Status: He is alert and oriented to person, place, and time.     The results of significant diagnostics from this hospitalization (including imaging, microbiology, ancillary and laboratory) are listed below for reference.     Microbiology: No results found for this or any previous visit (from the past 240 hour(s)).   Labs: BNP (last 3 results) No results for input(s): "BNP" in the last 8760 hours. Basic Metabolic Panel: Recent Labs  Lab 08/26/22 1040 08/27/22 0400  NA 137 136  K 4.0 3.7   CL 101 102  CO2 27 26  GLUCOSE 109* 100*  BUN 19 16  CREATININE 1.12 1.09  CALCIUM 9.4 8.9   Liver Function Tests: Recent Labs  Lab 08/27/22 0400  AST 15  ALT 16  ALKPHOS 39  BILITOT 1.2  PROT 5.9*  ALBUMIN 3.4*    CBC: Recent Labs  Lab 08/26/22 1040 08/27/22 0400  WBC 5.5 4.7  HGB 16.5 15.4  HCT 48.7 45.9  MCV 83.8 85.3  PLT 212 188    CBG: Recent Labs  Lab 08/26/22 1036  GLUCAP 107*    Hgb A1c Recent Labs    08/26/22 1853  HGBA1C 5.5   Lipid Profile Recent Labs    08/27/22 0400  CHOL 209*  HDL 58  LDLCALC 130*  TRIG 104  CHOLHDL 3.6   Thyroid function studies Recent  Labs    08/27/22 0400  TSH 5.141*    Sepsis Labs Recent Labs  Lab 08/26/22 1040 08/27/22 0400  WBC 5.5 4.7   Microbiology No results found for this or any previous visit (from the past 240 hour(s)).  Procedures/Studies: ECHOCARDIOGRAM COMPLETE  Result Date: 08/27/2022    ECHOCARDIOGRAM REPORT   Patient Name:   Darren Hess Date of Exam: 08/27/2022 Medical Rec #:  [id number]       Height:       70.0 in Accession #:    [id number]      Weight:       173.0 lb Date of Birth:  [birth year]      BSA:          1.963 m Patient Age:    66 years        BP:           147/83 mmHg Patient Gender: M               HR:           57 bpm. Exam Location:  Inpatient Procedure: 2D Echo, Cardiac Doppler, Color Doppler and Intracardiac            Opacification Agent Indications:    Stroke  History:        Patient has prior history of Echocardiogram examinations.                 Abnormal ECG, Stroke; Risk Factors:Dyslipidemia.  Sonographer:    Sheralyn Boatman RDCS Referring Phys: [id number] [facility] J PATEL  Sonographer Comments: Technically difficult study due to poor echo windows. IMPRESSIONS  1. Significant beat to beat variability in EF due to ectopy.  2. Left ventricular ejection fraction, by estimation, is 55 to 60%. The left ventricle has normal function. The left ventricle has no regional wall  motion abnormalities. Left ventricular diastolic parameters are consistent with Grade I diastolic dysfunction (impaired relaxation).  3. Right ventricular systolic function is normal. The right ventricular size is normal.  4. The mitral valve is normal in structure. No evidence of mitral valve regurgitation. No evidence of mitral stenosis.  5. The aortic valve is normal in structure. There is mild calcification of the aortic valve. Aortic valve regurgitation is not visualized. No aortic stenosis is present.  6. The inferior vena cava is normal in size with greater than 50% respiratory variability, suggesting right atrial pressure of 3 mmHg. FINDINGS  Left Ventricle: Left ventricular ejection fraction, by estimation, is 55 to 60%. The left ventricle has normal function. The left ventricle has no regional wall motion abnormalities. The left ventricular internal cavity size was normal in size. There is  no left ventricular hypertrophy. Left ventricular diastolic parameters are consistent with Grade I diastolic dysfunction (impaired relaxation). Right Ventricle: The right ventricular size is normal. No increase in right ventricular wall thickness. Right ventricular systolic function is normal. Left Atrium: Left atrial size was normal in size. Right Atrium: Right atrial size was normal in size. Pericardium: There is no evidence of pericardial effusion. Mitral Valve: The mitral valve is normal in structure. No evidence of mitral valve regurgitation. No evidence of mitral valve stenosis. Tricuspid Valve: The tricuspid valve is normal in structure. Tricuspid valve regurgitation is not demonstrated. No evidence of tricuspid stenosis. Aortic Valve: The aortic valve is normal in structure. There is mild calcification of the aortic valve. Aortic valve regurgitation is not visualized. No aortic stenosis is present. Pulmonic Valve: The pulmonic valve  was normal in structure. Pulmonic valve regurgitation is trivial. No evidence of  pulmonic stenosis. Aorta: The aortic root is normal in size and structure. Venous: The inferior vena cava is normal in size with greater than 50% respiratory variability, suggesting right atrial pressure of 3 mmHg. IAS/Shunts: No atrial level shunt detected by color flow Doppler.  LEFT VENTRICLE PLAX 2D LVIDd:         5.00 cm     Diastology LVIDs:         3.80 cm     LV e' medial:    5.77 cm/s LV PW:         0.90 cm     LV E/e' medial:  11.7 LV IVS:        0.80 cm     LV e' lateral:   7.83 cm/s LVOT diam:     2.50 cm     LV E/e' lateral: 8.6 LV SV:         89 LV SV Index:   46 LVOT Area:     4.91 cm  LV Volumes (MOD) LV vol d, MOD A2C: 96.2 ml LV vol d, MOD A4C: 95.5 ml LV vol s, MOD A2C: 38.2 ml LV vol s, MOD A4C: 41.1 ml LV SV MOD A2C:     58.0 ml LV SV MOD A4C:     95.5 ml LV SV MOD BP:      59.7 ml RIGHT VENTRICLE            IVC RV S prime:     9.90 cm/s  IVC diam: 1.90 cm TAPSE (M-mode): 3.1 cm LEFT ATRIUM             Index        RIGHT ATRIUM           Index LA diam:        3.10 cm 1.58 cm/m   RA Area:     13.10 cm LA Vol (A2C):   34.8 ml 17.73 ml/m  RA Volume:   31.40 ml  16.00 ml/m LA Vol (A4C):   25.8 ml 13.15 ml/m LA Biplane Vol: 30.0 ml 15.29 ml/m  AORTIC VALVE LVOT Vmax:   83.80 cm/s LVOT Vmean:  56.100 cm/s LVOT VTI:    0.182 m  AORTA Ao Root diam: 4.20 cm Ao Asc diam:  3.60 cm MITRAL VALVE MV Area (PHT): 2.81 cm    SHUNTS MV Decel Time: 270 msec    Systemic VTI:  0.18 m MV E velocity: 67.35 cm/s  Systemic Diam: 2.50 cm MV A velocity: 89.10 cm/s MV E/A ratio:  0.76 Aditya Sabharwal Electronically signed by Dorthula Nettles Signature Date/Time: 08/27/2022/2:23:33 PM    Final    CT ANGIO HEAD NECK W WO CM  Result Date: 08/27/2022 CLINICAL DATA:  Stroke/TIA EXAM: CT ANGIOGRAPHY HEAD AND NECK WITH AND WITHOUT CONTRAST TECHNIQUE: Multidetector CT imaging of the head and neck was performed using the standard protocol during bolus administration of intravenous contrast. Multiplanar CT image  reconstructions and MIPs were obtained to evaluate the vascular anatomy. Carotid stenosis measurements (when applicable) are obtained utilizing NASCET criteria, using the distal internal carotid diameter as the denominator. RADIATION DOSE REDUCTION: This exam was performed according to the departmental dose-optimization program which includes automated exposure control, adjustment of the mA and/or kV according to patient size and/or use of iterative reconstruction technique. CONTRAST:  75mL OMNIPAQUE IOHEXOL 350 MG/ML SOLN COMPARISON:  Same day brain MRI FINDINGS: CT HEAD FINDINGS  Brain: There is no acute intracranial hemorrhage, extra-axial fluid collection, or acute infarct Parenchymal volume is normal. The ventricles are normal in size. Gray-white differentiation is preserved. The pituitary and suprasellar region are normal. There is no mass lesion. There is no mass effect or midline shift. Vascular: See below. Skull: Normal. Negative for fracture or focal lesion. Sinuses/Orbits: The paranasal sinuses are clear. The globes and orbits are unremarkable. Other: The mastoid air cells and middle ear cavities are clear. Review of the MIP images confirms the above findings CTA NECK FINDINGS Aortic arch: The imaged aortic arch is normal. The origins of the major branch vessels are patent. The subclavian arteries are patent to the level imaged. Right carotid system: The right common, internal, and external carotid arteries are patent, without hemodynamically significant stenosis or occlusion there is no evidence of dissection or aneurysm. Apparent luminal hypodensity in the proximal internal carotid artery is favored to reflect mixing artifact. Left carotid system: The left common, internal, and external carotid arteries are patent, without hemodynamically significant stenosis or occlusion. There is no evidence of dissection or aneurysm. Vertebral arteries: The vertebral arteries are patent, without hemodynamically  significant stenosis or occlusion there is no evidence of dissection or aneurysm. Skeleton: There is no acute osseous abnormality or suspicious osseous lesion. There is no visible canal hematoma. There is overall mild multilevel degenerative change of the cervical spine. Other neck: Soft tissues of the neck are unremarkable. Upper chest: The imaged lung apices are clear. Review of the MIP images confirms the above findings CTA HEAD FINDINGS Anterior circulation: The intracranial ICAs are patent, with mild plaque on the right but no significant stenosis or occlusion. The bilateral MCAs and ACAS are patent, without proximal stenosis or occlusion. The anterior communicating artery is normal. There is a 3 mm aneurysm at the right PCOM origin. Posterior circulation: The bilateral V4 segments are patent. The basilar artery is patent. The major cerebellar arteries appear patent. The bilateral PCAs are patent, without proximal stenosis or occlusion. The PCAs are predominantly supplied by posterior communicating arteries bilaterally with a diminutive right P1 segment (fetal origins). There is no posterior circulation aneurysm. Venous sinuses: Patent. Anatomic variants: As above. Review of the MIP images confirms the above findings IMPRESSION: 1. Patent vasculature of the head and neck with no hemodynamically significant stenosis, occlusion, or dissection. 2. 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 Electronically Signed   By: Lesia Hausen M.D.   On: 08/27/2022 12:47   MR BRAIN WO CONTRAST  Result Date: 08/27/2022 CLINICAL DATA:  Neuro deficit with acute stroke suspected. EXAM: MRI HEAD WITHOUT CONTRAST TECHNIQUE: Multiplanar, multiecho pulse sequences of the brain and surrounding structures were obtained without intravenous contrast. COMPARISON:  Head CT from earlier the same day FINDINGS: Brain: No acute infarction, hemorrhage, hydrocephalus, extra-axial collection or mass lesion. FLAIR hyperintensities scattered in the  cerebral white matter attributed to chronic small vessel ischemia, mild for age. Vascular: Normal flow voids. Skull and upper cervical spine: Normal marrow signal. Sinuses/Orbits: Negative. IMPRESSION: 1. No acute or reversible finding. 2. Mild chronic small vessel ischemia. Electronically Signed   By: Tiburcio Pea M.D.   On: 08/27/2022 09:42   CT Head Wo Contrast  Result Date: 08/26/2022 CLINICAL DATA:  Neuro deficit, acute, stroke suspected. EXAM: CT HEAD WITHOUT CONTRAST TECHNIQUE: Contiguous axial images were obtained from the base of the skull through the vertex without intravenous contrast. RADIATION DOSE REDUCTION: This exam was performed according to the departmental dose-optimization program which includes  automated exposure control, adjustment of the mA and/or kV according to patient size and/or use of iterative reconstruction technique. COMPARISON:  None Available. FINDINGS: Brain: No evidence of acute infarction, hemorrhage, hydrocephalus, extra-axial collection or mass lesion/mass effect. Vascular: No hyperdense vessel identified. Skull: No acute fracture. Sinuses/Orbits: Clear sinuses.  No acute orbital findings. Other: No mastoid effusions. IMPRESSION: No evidence of acute intracranial abnormality. Electronically Signed   By: Feliberto Harts M.D.   On: 08/26/2022 12:05    Time coordinating discharge: 45 mins  SIGNED:  Carollee Herter, DO [facility] 08/27/2022, 2:54 PM

## 2022-08-27 NOTE — Subjective & Objective (Addendum)
Admitted for lip numbness and weakness left arm and TIA workup. Pt still working as a Education officer, community.

## 2022-08-27 NOTE — Progress Notes (Addendum)
STROKE TEAM PROGRESS NOTE   BRIEF HPI Mr. Darren Hess is a 67 y.o. male with history of anxiety, left lower extremity melanoma presenting with multiple brief episodes of numbness on left side of body that progressively lasted 1 to 3 minutes.  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  Admitted for TIA workup   SIGNIFICANT HOSPITAL EVENTS 7/15-states he had a brief episode of left-sided numbness on Sunday lasting 1 to 2 seconds, then had another episode with mildly elevated blood pressure.  On Monday he then had another 2 or 3 episodes of left-sided numbness on his face and hand lasting 1 to 2 minutes and decided to be evaluated  INTERIM HISTORY/SUBJECTIVE He states he has not had any more episodes of cheiro oral paresthesiassince being in the hospital denies headache at the time of these episodes.  LDL is 130.  MRI was negative for acute stroke.  CTA with no LVO, 3 mm aneurysm of right P-comm. He has been started on atorvastatin 40 mg, aspirin 81 mg and Plavix 75 mg for 3 weeks then aspirin alone    CBC    Component Value Date/Time   WBC 4.7 08/27/2022 0400   RBC 5.38 08/27/2022 0400   HGB 15.4 08/27/2022 0400   HCT 45.9 08/27/2022 0400   PLT 188 08/27/2022 0400   MCV 85.3 08/27/2022 0400   MCV 90.5 02/01/2013 1804   MCH 28.6 08/27/2022 0400   MCHC 33.6 08/27/2022 0400   RDW 13.2 08/27/2022 0400   LYMPHSABS 1.6 08/19/2012 1445   MONOABS 0.5 08/19/2012 1445   EOSABS 0.1 08/19/2012 1445   BASOSABS 0.0 08/19/2012 1445    BMET    Component Value Date/Time   NA 136 08/27/2022 0400   K 3.7 08/27/2022 0400   CL 102 08/27/2022 0400   CO2 26 08/27/2022 0400   GLUCOSE 100 (H) 08/27/2022 0400   BUN 16 08/27/2022 0400   CREATININE 1.09 08/27/2022 0400   CREATININE 1.09 02/01/2013 1804   CALCIUM 8.9 08/27/2022 0400   GFRNONAA >60 08/27/2022 0400    IMAGING past 24 hours ECHOCARDIOGRAM COMPLETE  Result Date: 08/27/2022    ECHOCARDIOGRAM REPORT   Patient Name:    Darren Hess Date of Exam: 08/27/2022 Medical Rec #:  [id number]       Height:       70.0 in Accession #:    [id number]      Weight:       173.0 lb Date of Birth:  07/08/1955      BSA:          1.963 m Patient Age:    66 years        BP:           147/83 mmHg Patient Gender: M               HR:           57  bpm. Exam Location:  Inpatient Procedure: 2D Echo, Cardiac Doppler, Color Doppler and Intracardiac            Opacification Agent Indications:    Stroke  History:        Patient has prior history of Echocardiogram examinations.                 Abnormal ECG, Stroke; Risk Factors:Dyslipidemia.  Sonographer:    Sheralyn Boatman RDCS Referring Phys: [id number] [facility] J PATEL  Sonographer Comments: Technically difficult study due to poor echo windows. IMPRESSIONS  1. Significant beat to beat variability in EF due to ectopy.  2. Left ventricular ejection fraction, by estimation, is 55 to 60%. The left ventricle has normal function. The left ventricle has no regional wall motion abnormalities. Left ventricular diastolic parameters are consistent with Grade I diastolic dysfunction (impaired relaxation).  3. Right ventricular systolic function is normal. The right ventricular size is normal.  4. The mitral valve is normal in structure. No evidence of mitral valve regurgitation. No evidence of mitral stenosis.  5. The aortic valve is normal in structure. There is mild calcification of the aortic valve. Aortic valve regurgitation is not visualized. No aortic stenosis is present.  6. The inferior vena cava is normal in size with greater than 50% respiratory variability, suggesting right atrial pressure of 3 mmHg. FINDINGS  Left Ventricle: Left ventricular ejection fraction, by estimation, is 55 to 60%. The left ventricle has normal function. The left ventricle has no regional wall motion abnormalities. The left ventricular internal cavity size was normal in size. There is  no left ventricular hypertrophy. Left ventricular  diastolic parameters are consistent with Grade I diastolic dysfunction (impaired relaxation). Right Ventricle: The right ventricular size is normal. No increase in right ventricular wall thickness. Right ventricular systolic function is normal. Left Atrium: Left atrial size was normal in size. Right Atrium: Right atrial size was normal in size. Pericardium: There is no evidence of pericardial effusion. Mitral Valve: The mitral valve is normal in structure. No evidence of mitral valve regurgitation. No evidence of mitral valve stenosis. Tricuspid Valve: The tricuspid valve is normal in structure. Tricuspid valve regurgitation is not demonstrated. No evidence of tricuspid stenosis. Aortic Valve: The aortic valve is normal in structure. There is mild calcification of the aortic valve. Aortic valve regurgitation is not visualized. No aortic stenosis is present. Pulmonic Valve: The pulmonic valve was normal in structure. Pulmonic valve regurgitation is trivial. No evidence of pulmonic stenosis. Aorta: The aortic root is normal in size and structure. Venous: The inferior vena cava is normal in size with greater than 50% respiratory variability, suggesting right atrial pressure of 3 mmHg. IAS/Shunts: No atrial level shunt detected by color flow Doppler.  LEFT VENTRICLE PLAX 2D LVIDd:         5.00 cm     Diastology LVIDs:         3.80 cm     LV e' medial:    5.77 cm/s LV PW:         0.90 cm     LV E/e' medial:  11.7 LV IVS:        0.80 cm     LV e' lateral:   7.83 cm/s LVOT diam:     2.50 cm     LV E/e' lateral: 8.6 LV SV:         89 LV SV Index:   46 LVOT Area:     4.91 cm  LV Volumes (MOD) LV vol d, MOD A2C: 96.2 ml LV vol d, MOD A4C: 95.5 ml LV vol s, MOD A2C: 38.2 ml LV vol s, MOD A4C: 41.1 ml LV SV MOD A2C:     58.0 ml LV SV MOD A4C:     95.5 ml LV SV MOD BP:      59.7 ml RIGHT VENTRICLE            IVC RV S prime:     9.90 cm/s  IVC diam: 1.90 cm TAPSE (M-mode): 3.1 cm LEFT ATRIUM  Index        RIGHT ATRIUM            Index LA diam:        3.10 cm 1.58 cm/m   RA Area:     13.10 cm LA Vol (A2C):   34.8 ml 17.73 ml/m  RA Volume:   31.40 ml  16.00 ml/m LA Vol (A4C):   25.8 ml 13.15 ml/m LA Biplane Vol: 30.0 ml 15.29 ml/m  AORTIC VALVE LVOT Vmax:   83.80 cm/s LVOT Vmean:  56.100 cm/s LVOT VTI:    0.182 m  AORTA Ao Root diam: 4.20 cm Ao Asc diam:  3.60 cm MITRAL VALVE MV Area (PHT): 2.81 cm    SHUNTS MV Decel Time: 270 msec    Systemic VTI:  0.18 m MV E velocity: 67.35 cm/s  Systemic Diam: 2.50 cm MV A velocity: 89.10 cm/s MV E/A ratio:  0.76 Aditya Sabharwal Electronically signed by Dorthula Nettles Signature Date/Time: 08/27/2022/2:23:33 PM    Final    CT ANGIO HEAD NECK W WO CM  Result Date: 08/27/2022 CLINICAL DATA:  Stroke/TIA EXAM: CT ANGIOGRAPHY HEAD AND NECK WITH AND WITHOUT CONTRAST TECHNIQUE: Multidetector CT imaging of the head and neck was performed using the standard protocol during bolus administration of intravenous contrast. Multiplanar CT image reconstructions and MIPs were obtained to evaluate the vascular anatomy. Carotid stenosis measurements (when applicable) are obtained utilizing NASCET criteria, using the distal internal carotid diameter as the denominator. RADIATION DOSE REDUCTION: This exam was performed according to the departmental dose-optimization program which includes automated exposure control, adjustment of the mA and/or kV according to patient size and/or use of iterative reconstruction technique. CONTRAST:  75mL OMNIPAQUE IOHEXOL 350 MG/ML SOLN COMPARISON:  Same day brain MRI FINDINGS: CT HEAD FINDINGS Brain: There is no acute intracranial hemorrhage, extra-axial fluid collection, or acute infarct Parenchymal volume is normal. The ventricles are normal in size. Gray-white differentiation is preserved. The pituitary and suprasellar region are normal. There is no mass lesion. There is no mass effect or midline shift. Vascular: See below. Skull: Normal. Negative for fracture or  focal lesion. Sinuses/Orbits: The paranasal sinuses are clear. The globes and orbits are unremarkable. Other: The mastoid air cells and middle ear cavities are clear. Review of the MIP images confirms the above findings CTA NECK FINDINGS Aortic arch: The imaged aortic arch is normal. The origins of the major branch vessels are patent. The subclavian arteries are patent to the level imaged. Right carotid system: The right common, internal, and external carotid arteries are patent, without hemodynamically significant stenosis or occlusion there is no evidence of dissection or aneurysm. Apparent luminal hypodensity in the proximal internal carotid artery is favored to reflect mixing artifact. Left carotid system: The left common, internal, and external carotid arteries are patent, without hemodynamically significant stenosis or occlusion. There is no evidence of dissection or aneurysm. Vertebral arteries: The vertebral arteries are patent, without hemodynamically significant stenosis or occlusion there is no evidence of dissection or aneurysm. Skeleton: There is no acute osseous abnormality or suspicious osseous lesion. There is no visible canal hematoma. There is overall mild multilevel degenerative change of the cervical spine. Other neck: Soft tissues of the neck are unremarkable. Upper chest: The imaged lung apices are clear. Review of the MIP images confirms the above findings CTA HEAD FINDINGS Anterior circulation: The intracranial ICAs are patent, with mild plaque on the right but no significant stenosis or occlusion. The bilateral MCAs and ACAS are patent, without  proximal stenosis or occlusion. The anterior communicating artery is normal. There is a 3 mm aneurysm at the right PCOM origin. Posterior circulation: The bilateral V4 segments are patent. The basilar artery is patent. The major cerebellar arteries appear patent. The bilateral PCAs are patent, without proximal stenosis or occlusion. The PCAs are  predominantly supplied by posterior communicating arteries bilaterally with a diminutive right P1 segment (fetal origins). There is no posterior circulation aneurysm. Venous sinuses: Patent. Anatomic variants: As above. Review of the MIP images confirms the above findings IMPRESSION: 1. Patent vasculature of the head and neck with no hemodynamically significant stenosis, occlusion, or dissection. 2. 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 Electronically Signed   By: Lesia Hausen M.D.   On: 08/27/2022 12:47   MR BRAIN WO CONTRAST  Result Date: 08/27/2022 CLINICAL DATA:  Neuro deficit with acute stroke suspected. EXAM: MRI HEAD WITHOUT CONTRAST TECHNIQUE: Multiplanar, multiecho pulse sequences of the brain and surrounding structures were obtained without intravenous contrast. COMPARISON:  Head CT from earlier the same day FINDINGS: Brain: No acute infarction, hemorrhage, hydrocephalus, extra-axial collection or mass lesion. FLAIR hyperintensities scattered in the cerebral white matter attributed to chronic small vessel ischemia, mild for age. Vascular: Normal flow voids. Skull and upper cervical spine: Normal marrow signal. Sinuses/Orbits: Negative. IMPRESSION: 1. No acute or reversible finding. 2. Mild chronic small vessel ischemia. Electronically Signed   By: Tiburcio Pea M.D.   On: 08/27/2022 09:42    Vitals:   08/26/22 2310 08/27/22 0316 08/27/22 1012 08/27/22 1341  BP: 115/67 130/81 (!) 147/83 (!) 167/91  Pulse: 73 65 82 84  Resp: 18 18 18 20   Temp: 97.8 F (36.6 C) 97.7 F (36.5 C) 98.1 F (36.7 C) 98.2 F (36.8 C)  TempSrc: Oral Oral Oral Oral  SpO2: 98% 100% 98% 100%  Weight:      Height:         PHYSICAL EXAM General:  Alert, well-nourished, well-developed patient in no acute distress Psych:  Mood and affect appropriate for situation CV: Regular rate and rhythm on monitor Respiratory:  Regular, unlabored respirations on room air GI: Abdomen soft and nontender   NEURO:   Mental Status: AA&Ox3, patient is able to give clear and coherent history Speech/Language: speech is without dysarthria or aphasia.  Naming, repetition, fluency, and comprehension intact.  Cranial Nerves:  II: PERRL. Visual fields full.  III, IV, VI: EOMI. Eyelids elevate symmetrically.  V: Sensation is intact to light touch and symmetrical to face.  VII: Face is symmetrical resting and smiling VIII: hearing intact to voice. IX, X: Palate elevates symmetrically. Phonation is normal.  ZO:[id number] shrug 5/5. XII: tongue is midline without fasciculations. Motor: 5/5 strength to all muscle groups tested.  Tone: is normal and bulk is normal Sensation- Intact to light touch bilaterally. Extinction absent to light touch to DSS.   Coordination: FTN intact bilaterally, HKS: no ataxia in BLE.No drift.  Gait- deferred   ASSESSMENT/PLAN  TIA: Recurrent transient left cheiro oral paresthesias due to small vessel disease CT head No acute abnormality.  CTA head & neck no LVO, 3 mm aneurysm of right PCOM MRI no acute process.  Mild chronic small vessel ischemia 2D Echo EF 55 to 60%.  LV with grade 1 diastolic dysfunction LDL 130 HgbA1c 5.5 Will need outpatient follow-up with neurology in 8 weeks VTE prophylaxis -SCDs No antithrombotics prior to admission, now on aspirin 81 mg and Plavix 75 mg for 3 weeks and then aspirin alone. Therapy  recommendations:  No follow up needed Disposition: Pending  Hyperlipidemia Home meds: None LDL 130, goal < 70 Add atorvastatin 40 mg Continue statin at discharge   Other Stroke Risk Factors Congestive heart failure  Hospital day # 0  Gevena Mart DNP, ACNPC-AG  [facility]  STROKE MD NOTE :  I have personally obtained history,examined this patient, reviewed notes, independently viewed imaging studies, participated in medical decision making and plan of care.ROS completed by me personally and pertinent positives fully documented  I have  made any additions or clarifications directly to the above note. Agree with note above.  Patient presented with 2 transient less than 1 minute episodes of left-sided cheiro oral paresthesias on Sunday and 3 similar slightly longer episodes on next day likely right hemispheric subcortical TIA's from small vessel disease.  Recommend aspirin and Plavix for 3 weeks followed by aspirin alone and aggressive risk factor modification.  Patient advised to maintain good hydration.  Eat a healthy diet and be active and exercise regularly.  Return for follow-up in the future in 2 months in the stroke clinic or call earlier if necessary.  Greater than 50% time during this 50-minute visit was spent on counseling and coordination of care about his TIAs and discussion about stroke evaluation and prevention and treatment and answering questions.  Delia Heady, MD Medical Director [facility] Pager: [phone] 08/27/2022 3:34 PM   To contact Stroke Continuity provider, please refer to [email or web address]. After hours, contact General Neurology

## 2022-08-27 NOTE — Hospital Course (Addendum)
HPI: Darren Hess is a 67 y.o. male with medical history significant of  anxiety, bursitis, dizziness, pneumonia presented to [facility] ED with multiple episodes of left-sided weakness and numbness. His symptoms started yesterday where he had 2 episodes of left sided numbness and weakness resolving in 1-3 mins. He had another episode this morning at 8.40am when he was seeing his patient.  He reports tingling in the left lips, jaw and weakness and sensory loss in the arms (starting at the fingers) and moving towards the left leg. Again lasted 1-3 mins so called EMS and was admitted to [facility]. Now has a mild tension headache. Denies facial droop, slurring of speech headache, vision changes, confusion, vomiting, speech changes, seizures, ataxia or urinary/bowel incontinence.    Imaging CT head negative for acute stroke MRI pending  CT angio head and neck pending    ED course Vital signs on admission: BP 180/99, respiratory 18, heart rate 84, temp 98, sats 99% on room air. Labs: BMP unremarkable except glucose 109, CBC within normal limits.   Significant Events: Admitted 08/26/2022   Significant Labs: TC 209, LCL 130, HDL 58, TG 104  Significant Imaging Studies: CT head negative for acute abnormality MRI brain negative for acute CVA CTA negative for LVO. 3 mm Right side PCOM aneurysm  Echo showed normal LVEF. No shunt  Antibiotic Therapy:   Procedures:   Consultants: neurology

## 2022-08-27 NOTE — Progress Notes (Signed)
Patient enrolled for [employer] to ship a 30 day cardiac event monitor to his address on file.  Letter with instructions mailed to patient.  Dr. Carolan Clines to read.

## 2022-08-27 NOTE — Assessment & Plan Note (Signed)
Wife states pt has reported palpitations at home recently. Will ask cardiology for Z[employer]. Epic message sent to cardiology.

## 2022-08-27 NOTE — Assessment & Plan Note (Signed)
Start lipitor 40 mg at bedtime.

## 2022-08-30 DIAGNOSIS — M549 Dorsalgia, unspecified: Secondary | ICD-10-CM | POA: Diagnosis not present

## 2022-08-30 DIAGNOSIS — I671 Cerebral aneurysm, nonruptured: Secondary | ICD-10-CM | POA: Diagnosis not present

## 2022-08-30 DIAGNOSIS — Z8673 Personal history of transient ischemic attack (TIA), and cerebral infarction without residual deficits: Secondary | ICD-10-CM | POA: Diagnosis not present

## 2022-08-30 DIAGNOSIS — E785 Hyperlipidemia, unspecified: Secondary | ICD-10-CM | POA: Diagnosis not present

## 2022-08-30 DIAGNOSIS — I493 Ventricular premature depolarization: Secondary | ICD-10-CM | POA: Diagnosis not present

## 2022-08-30 DIAGNOSIS — D049 Carcinoma in situ of skin, unspecified: Secondary | ICD-10-CM | POA: Diagnosis not present

## 2022-08-30 DIAGNOSIS — R7989 Other specified abnormal findings of blood chemistry: Secondary | ICD-10-CM | POA: Diagnosis not present

## 2022-08-30 DIAGNOSIS — R0683 Snoring: Secondary | ICD-10-CM | POA: Diagnosis not present

## 2022-08-30 DIAGNOSIS — R030 Elevated blood-pressure reading, without diagnosis of hypertension: Secondary | ICD-10-CM | POA: Diagnosis not present

## 2022-09-02 DIAGNOSIS — R002 Palpitations: Secondary | ICD-10-CM

## 2022-09-02 DIAGNOSIS — G459 Transient cerebral ischemic attack, unspecified: Secondary | ICD-10-CM

## 2022-09-02 DIAGNOSIS — I671 Cerebral aneurysm, nonruptured: Secondary | ICD-10-CM

## 2022-09-04 ENCOUNTER — Telehealth (HOSPITAL_COMMUNITY): Payer: Self-pay

## 2022-09-04 ENCOUNTER — Other Ambulatory Visit (HOSPITAL_COMMUNITY): Payer: Self-pay | Admitting: Interventional Radiology

## 2022-09-04 DIAGNOSIS — I671 Cerebral aneurysm, nonruptured: Secondary | ICD-10-CM

## 2022-09-04 NOTE — Telephone Encounter (Signed)
Called to schedule consult with Dr. Deveshwar, no answer, left vm. AB  

## 2022-09-11 DIAGNOSIS — I671 Cerebral aneurysm, nonruptured: Secondary | ICD-10-CM | POA: Diagnosis not present

## 2022-10-24 NOTE — Progress Notes (Deleted)
  Cardiology Office Note:  .   Date:  10/24/2022  ID:  Darren Hess, DOB 1955/11/12, MRN [id number] PCP: Creola Corn, MD  [facility] Providers Cardiologist:  None { Click to update primary MD,subspecialty MD or APP then REFRESH:1}   History of Present Illness: .   Darren Hess is a 67 y.o. male , no significant PMHx.  P/w stroke like symptoms. MRI brain negative for acute CVA  Diagnoses was TIA. A 30 day event monitor was ordered and not done ***.  Echo was urnemarkable.  ROS:  per HPI otherwise negative   Studies Reviewed: .        *** Risk Assessment/Calculations:   {Does this patient have ATRIAL FIBRILLATION?:[phone]} No BP recorded.  {Refresh Note OR Click here to enter BP  :1}***       Physical Exam:   VS:  There were no vitals taken for this visit.   Wt Readings from Last 3 Encounters:  08/26/22 173 lb (78.5 kg)  03/03/15 175 lb (79.4 kg)  04/02/13 175 lb (79.4 kg)    GEN: Well nourished, well developed in no acute distress NECK: No JVD; No carotid bruits CARDIAC: ***RRR, no murmurs, rubs, gallops RESPIRATORY:  Clear to auscultation without rales, wheezing or rhonchi  ABDOMEN: Soft, non-tender, non-distended EXTREMITIES:  No edema; No deformity   ASSESSMENT AND PLAN: .   ***    {Are you ordering a CV Procedure (e.g. stress test, cath, DCCV, TEE, etc)?   Press F2        :[id number]}  Dispo: ***  Signed, Maisie Fus, MD

## 2022-10-25 ENCOUNTER — Ambulatory Visit: Payer: PPO | Attending: Internal Medicine | Admitting: Internal Medicine

## 2022-10-28 ENCOUNTER — Encounter: Payer: Self-pay | Admitting: Internal Medicine

## 2022-11-13 ENCOUNTER — Ambulatory Visit: Payer: PPO | Attending: Physician Assistant

## 2022-11-13 DIAGNOSIS — I671 Cerebral aneurysm, nonruptured: Secondary | ICD-10-CM

## 2022-11-13 DIAGNOSIS — G459 Transient cerebral ischemic attack, unspecified: Secondary | ICD-10-CM

## 2022-11-13 DIAGNOSIS — R002 Palpitations: Secondary | ICD-10-CM

## 2022-11-20 ENCOUNTER — Telehealth: Payer: Self-pay

## 2022-11-20 NOTE — Telephone Encounter (Signed)
Lmom to discuss monitor results. Waiting on a return call.

## 2022-11-27 NOTE — Telephone Encounter (Signed)
Attempted to reach pt to discuss monitor results. No, Answer. Will mail pts results.

## 2023-01-01 ENCOUNTER — Telehealth (HOSPITAL_COMMUNITY): Payer: Self-pay

## 2023-01-01 NOTE — Telephone Encounter (Signed)
Called to schedule consult, no answer, left vm. AB

## 2023-02-28 DIAGNOSIS — L821 Other seborrheic keratosis: Secondary | ICD-10-CM | POA: Diagnosis not present

## 2023-02-28 DIAGNOSIS — L570 Actinic keratosis: Secondary | ICD-10-CM | POA: Diagnosis not present

## 2023-02-28 DIAGNOSIS — D225 Melanocytic nevi of trunk: Secondary | ICD-10-CM | POA: Diagnosis not present

## 2023-02-28 DIAGNOSIS — Z85828 Personal history of other malignant neoplasm of skin: Secondary | ICD-10-CM | POA: Diagnosis not present

## 2023-02-28 DIAGNOSIS — L309 Dermatitis, unspecified: Secondary | ICD-10-CM | POA: Diagnosis not present

## 2023-02-28 DIAGNOSIS — L814 Other melanin hyperpigmentation: Secondary | ICD-10-CM | POA: Diagnosis not present

## 2023-02-28 DIAGNOSIS — D1801 Hemangioma of skin and subcutaneous tissue: Secondary | ICD-10-CM | POA: Diagnosis not present

## 2023-04-11 DIAGNOSIS — R030 Elevated blood-pressure reading, without diagnosis of hypertension: Secondary | ICD-10-CM | POA: Diagnosis not present

## 2023-04-11 DIAGNOSIS — Z125 Encounter for screening for malignant neoplasm of prostate: Secondary | ICD-10-CM | POA: Diagnosis not present

## 2023-04-11 DIAGNOSIS — Z1212 Encounter for screening for malignant neoplasm of rectum: Secondary | ICD-10-CM | POA: Diagnosis not present

## 2023-04-11 DIAGNOSIS — E785 Hyperlipidemia, unspecified: Secondary | ICD-10-CM | POA: Diagnosis not present

## 2023-04-11 DIAGNOSIS — R7989 Other specified abnormal findings of blood chemistry: Secondary | ICD-10-CM | POA: Diagnosis not present

## 2023-04-17 DIAGNOSIS — Z1212 Encounter for screening for malignant neoplasm of rectum: Secondary | ICD-10-CM | POA: Diagnosis not present

## 2023-04-17 DIAGNOSIS — E785 Hyperlipidemia, unspecified: Secondary | ICD-10-CM | POA: Diagnosis not present

## 2023-04-18 DIAGNOSIS — Z23 Encounter for immunization: Secondary | ICD-10-CM | POA: Diagnosis not present

## 2023-04-18 DIAGNOSIS — C439 Malignant melanoma of skin, unspecified: Secondary | ICD-10-CM | POA: Diagnosis not present

## 2023-04-18 DIAGNOSIS — R82998 Other abnormal findings in urine: Secondary | ICD-10-CM | POA: Diagnosis not present

## 2023-04-18 DIAGNOSIS — D049 Carcinoma in situ of skin, unspecified: Secondary | ICD-10-CM | POA: Diagnosis not present

## 2023-04-18 DIAGNOSIS — E785 Hyperlipidemia, unspecified: Secondary | ICD-10-CM | POA: Diagnosis not present

## 2023-04-18 DIAGNOSIS — I671 Cerebral aneurysm, nonruptured: Secondary | ICD-10-CM | POA: Diagnosis not present

## 2023-04-18 DIAGNOSIS — Z8673 Personal history of transient ischemic attack (TIA), and cerebral infarction without residual deficits: Secondary | ICD-10-CM | POA: Diagnosis not present

## 2023-04-18 DIAGNOSIS — R030 Elevated blood-pressure reading, without diagnosis of hypertension: Secondary | ICD-10-CM | POA: Diagnosis not present

## 2023-04-18 DIAGNOSIS — R7989 Other specified abnormal findings of blood chemistry: Secondary | ICD-10-CM | POA: Diagnosis not present

## 2023-04-18 DIAGNOSIS — R0683 Snoring: Secondary | ICD-10-CM | POA: Diagnosis not present

## 2023-04-18 DIAGNOSIS — Z0000 Encounter for general adult medical examination without abnormal findings: Secondary | ICD-10-CM | POA: Diagnosis not present

## 2023-04-18 DIAGNOSIS — I493 Ventricular premature depolarization: Secondary | ICD-10-CM | POA: Diagnosis not present

## 2023-05-23 DIAGNOSIS — C44622 Squamous cell carcinoma of skin of right upper limb, including shoulder: Secondary | ICD-10-CM | POA: Diagnosis not present

## 2023-05-23 DIAGNOSIS — Z85828 Personal history of other malignant neoplasm of skin: Secondary | ICD-10-CM | POA: Diagnosis not present

## 2023-08-09 ENCOUNTER — Encounter (HOSPITAL_COMMUNITY): Payer: Self-pay | Admitting: Interventional Radiology

## 2023-08-18 ENCOUNTER — Inpatient Hospital Stay (HOSPITAL_BASED_OUTPATIENT_CLINIC_OR_DEPARTMENT_OTHER)
Admission: EM | Admit: 2023-08-18 | Discharge: 2023-08-20 | DRG: 390 | Disposition: A | Discharge status: Home / Self Care | Attending: Internal Medicine | Admitting: Internal Medicine

## 2023-08-18 ENCOUNTER — Emergency Department (HOSPITAL_BASED_OUTPATIENT_CLINIC_OR_DEPARTMENT_OTHER)

## 2023-08-18 ENCOUNTER — Emergency Department (HOSPITAL_BASED_OUTPATIENT_CLINIC_OR_DEPARTMENT_OTHER): Admitting: Radiology

## 2023-08-18 ENCOUNTER — Other Ambulatory Visit: Payer: Self-pay

## 2023-08-18 ENCOUNTER — Observation Stay (HOSPITAL_COMMUNITY)

## 2023-08-18 ENCOUNTER — Encounter (HOSPITAL_COMMUNITY): Payer: Self-pay | Admitting: Internal Medicine

## 2023-08-18 DIAGNOSIS — J40 Bronchitis, not specified as acute or chronic: Secondary | ICD-10-CM | POA: Diagnosis not present

## 2023-08-18 DIAGNOSIS — Z4682 Encounter for fitting and adjustment of non-vascular catheter: Secondary | ICD-10-CM | POA: Diagnosis not present

## 2023-08-18 DIAGNOSIS — K8020 Calculus of gallbladder without cholecystitis without obstruction: Secondary | ICD-10-CM | POA: Diagnosis not present

## 2023-08-18 DIAGNOSIS — K449 Diaphragmatic hernia without obstruction or gangrene: Secondary | ICD-10-CM | POA: Diagnosis not present

## 2023-08-18 DIAGNOSIS — K56609 Unspecified intestinal obstruction, unspecified as to partial versus complete obstruction: Secondary | ICD-10-CM | POA: Diagnosis not present

## 2023-08-18 DIAGNOSIS — R0989 Other specified symptoms and signs involving the circulatory and respiratory systems: Secondary | ICD-10-CM | POA: Diagnosis not present

## 2023-08-18 DIAGNOSIS — K5669 Other partial intestinal obstruction: Secondary | ICD-10-CM | POA: Diagnosis not present

## 2023-08-18 DIAGNOSIS — Z8249 Family history of ischemic heart disease and other diseases of the circulatory system: Secondary | ICD-10-CM

## 2023-08-18 DIAGNOSIS — Z8673 Personal history of transient ischemic attack (TIA), and cerebral infarction without residual deficits: Secondary | ICD-10-CM

## 2023-08-18 DIAGNOSIS — R918 Other nonspecific abnormal finding of lung field: Secondary | ICD-10-CM | POA: Diagnosis not present

## 2023-08-18 DIAGNOSIS — K565 Intestinal adhesions [bands], unspecified as to partial versus complete obstruction: Secondary | ICD-10-CM

## 2023-08-18 DIAGNOSIS — K6389 Other specified diseases of intestine: Secondary | ICD-10-CM | POA: Diagnosis not present

## 2023-08-18 DIAGNOSIS — E785 Hyperlipidemia, unspecified: Secondary | ICD-10-CM | POA: Diagnosis present

## 2023-08-18 DIAGNOSIS — K56600 Partial intestinal obstruction, unspecified as to cause: Principal | ICD-10-CM

## 2023-08-18 DIAGNOSIS — K5651 Intestinal adhesions [bands], with partial obstruction: Secondary | ICD-10-CM | POA: Diagnosis not present

## 2023-08-18 DIAGNOSIS — Z79899 Other long term (current) drug therapy: Secondary | ICD-10-CM

## 2023-08-18 DIAGNOSIS — Z8701 Personal history of pneumonia (recurrent): Secondary | ICD-10-CM

## 2023-08-18 DIAGNOSIS — R1011 Right upper quadrant pain: Secondary | ICD-10-CM | POA: Diagnosis present

## 2023-08-18 DIAGNOSIS — Z809 Family history of malignant neoplasm, unspecified: Secondary | ICD-10-CM

## 2023-08-18 DIAGNOSIS — D72829 Elevated white blood cell count, unspecified: Secondary | ICD-10-CM | POA: Diagnosis present

## 2023-08-18 DIAGNOSIS — E860 Dehydration: Secondary | ICD-10-CM | POA: Diagnosis present

## 2023-08-18 DIAGNOSIS — K5730 Diverticulosis of large intestine without perforation or abscess without bleeding: Secondary | ICD-10-CM | POA: Diagnosis not present

## 2023-08-18 DIAGNOSIS — R12 Heartburn: Secondary | ICD-10-CM | POA: Diagnosis present

## 2023-08-18 DIAGNOSIS — R140 Abdominal distension (gaseous): Secondary | ICD-10-CM | POA: Diagnosis not present

## 2023-08-18 LAB — COMPREHENSIVE METABOLIC PANEL WITH GFR
ALT: 14 U/L (ref 0–44)
AST: 21 U/L (ref 15–41)
Albumin: 4.5 g/dL (ref 3.5–5.0)
Alkaline Phosphatase: 56 U/L (ref 38–126)
Anion gap: 11 (ref 5–15)
BUN: 18 mg/dL (ref 8–23)
CO2: 29 mmol/L (ref 22–32)
Calcium: 9.8 mg/dL (ref 8.9–10.3)
Chloride: 98 mmol/L (ref 98–111)
Creatinine, Ser: 1.08 mg/dL (ref 0.61–1.24)
GFR, Estimated: >60 mL/min (ref >60)
Glucose, Bld: 144 mg/dL — ABNORMAL HIGH (ref 70–99)
Potassium: 4.3 mmol/L (ref 3.5–5.1)
Sodium: 137 mmol/L (ref 135–145)
Total Bilirubin: 1.5 mg/dL — ABNORMAL HIGH (ref 0.0–1.2)
Total Protein: 7.3 g/dL (ref 6.5–8.1)

## 2023-08-18 LAB — CBC
HCT: 51.2 % (ref 39.0–52.0)
Hemoglobin: 17.2 g/dL — ABNORMAL HIGH (ref 13.0–17.0)
MCH: 28 pg (ref 26.0–34.0)
MCHC: 33.6 g/dL (ref 30.0–36.0)
MCV: 83.4 fL (ref 80.0–100.0)
Platelets: 256 K/uL (ref 150–400)
RBC: 6.14 MIL/uL — ABNORMAL HIGH (ref 4.22–5.81)
RDW: 13.3 % (ref 11.5–15.5)
WBC: 13.4 K/uL — ABNORMAL HIGH (ref 4.0–10.5)
nRBC: 0 % (ref 0.0–0.2)

## 2023-08-18 LAB — LIPASE, BLOOD: Lipase: 18 U/L (ref 11–51)

## 2023-08-18 MED ORDER — PANTOPRAZOLE SODIUM 40 MG IV SOLR
40.0000 mg | Freq: Every day | INTRAVENOUS | Status: DC
  Administered 2023-08-18 – 2023-08-19 (×2): 40 mg via INTRAVENOUS
  Filled 2023-08-18 (×2): qty 10

## 2023-08-18 MED ORDER — ACETAMINOPHEN 650 MG RE SUPP: 650.0000 mg | Freq: Four times a day (QID) | RECTAL | Status: DC | PRN

## 2023-08-18 MED ORDER — ONDANSETRON HCL 4 MG/2ML IJ SOLN
4.0000 mg | Freq: Once | INTRAMUSCULAR | Status: AC
  Administered 2023-08-18: 4 mg via INTRAVENOUS
  Filled 2023-08-18: qty 2

## 2023-08-18 MED ORDER — ENOXAPARIN SODIUM 40 MG/0.4ML IJ SOSY
40.0000 mg | PREFILLED_SYRINGE | Freq: Every day | INTRAMUSCULAR | Status: DC
  Administered 2023-08-18: 40 mg via SUBCUTANEOUS
  Filled 2023-08-18 (×2): qty 0.4

## 2023-08-18 MED ORDER — METHOCARBAMOL 1000 MG/10ML IJ SOLN: 500.0000 mg | Freq: Four times a day (QID) | INTRAMUSCULAR | Status: DC | PRN

## 2023-08-18 MED ORDER — HYDROMORPHONE HCL 1 MG/ML IJ SOLN
0.5000 mg | Freq: Once | INTRAMUSCULAR | Status: DC
  Filled 2023-08-18 (×2): qty 1

## 2023-08-18 MED ORDER — IOHEXOL 300 MG/ML  SOLN
100.0000 mL | Freq: Once | INTRAMUSCULAR | Status: AC | PRN
  Administered 2023-08-18: 100 mL via INTRAVENOUS

## 2023-08-18 MED ORDER — HYDROMORPHONE HCL 1 MG/ML IJ SOLN: 0.5000 mg | INTRAMUSCULAR | Status: DC | PRN

## 2023-08-18 MED ORDER — SODIUM CHLORIDE 0.9 % IV SOLN: INTRAVENOUS | Status: DC

## 2023-08-18 MED ORDER — ONDANSETRON HCL 4 MG/2ML IJ SOLN: 4.0000 mg | Freq: Four times a day (QID) | INTRAMUSCULAR | Status: DC | PRN

## 2023-08-18 MED ORDER — SODIUM CHLORIDE 0.9 % IV BOLUS
500.0000 mL | Freq: Once | INTRAVENOUS | Status: AC
  Administered 2023-08-18: 500 mL via INTRAVENOUS

## 2023-08-18 MED ORDER — DIPHENHYDRAMINE HCL 50 MG/ML IJ SOLN: 25.0000 mg | Freq: Once | INTRAMUSCULAR | Status: DC | PRN

## 2023-08-18 NOTE — Hospital Course (Signed)
 Darren Hess is a 68 y.o. male with medical history significant for TIA, HLD, diverticulitis who is admitted with small bowel obstruction likely due to adhesions.  General surgery following.

## 2023-08-18 NOTE — ED Triage Notes (Signed)
 C/o central ABD pain around 1500 yesterday. Hx of diverticulitis. States feels like a flare up.

## 2023-08-18 NOTE — Plan of Care (Signed)
 Plan of Care Note for accepted transfer   Patient: Darren Hess MRN: [id number]   DOA: 08/18/2023  Facility requesting transfer: [facility] Requesting Provider: Sonny Mclean, PA Reason for transfer: Partial small bowel obstruction Facility course: Patient is a 68 year old gentleman, history of diverticulitis presented to the ED with a 1 day history of left lower quadrant and left upper quadrant abdominal pain, nausea and vomiting after eating [place] food.  Patient felt it might have been secondary to his diverticulitis.  Patient seen in the ED vital signs stable.  Patient noted with a leukocytosis on CBC.  Comprehensive metabolic profile with a bilirubin of 1.5 otherwise unremarkable.  CT abdomen and pelvis done concerning for partial distal small bowel obstruction with transition point.  Patient placed on IV fluids.  Urinalysis pending.  ED PA spoke with general surgery Georgia Louder, PA/Dr. Ann) who recommended hospitalist admission and patient will be formally consulted on by general surgery when arrives to the hospital.  General surgery had recommended NG tube placement to ED PA.  Plan of care: The patient is accepted for admission to Med-surg  unit, at [facility]..    Author: Toribio Hummer, MD 08/18/2023  Check [email or web address] for on-call coverage.  Nursing staff, Please call [facility] Admits & Consults System-Wide number on Amion as soon as patient's arrival, so appropriate admitting provider can evaluate the pt.

## 2023-08-18 NOTE — ED Notes (Signed)
 Pt c/o worsening abd pain, advises that EDP said I could have something for pain but I turned her down. EDP notified, to place appropriate orders

## 2023-08-18 NOTE — Consult Note (Addendum)
 Darren Hess 19-Sep-1955  [id number].    Requesting MD: Dreama, MD Chief Complaint/Reason for Consult: SBO  HPI: Darren Hess is a 68 y/o M with a PMH diverticulitis and appendectomy who presents with acute onset abdominal pain. States the pain started 7/6 around 3 PM after eating [place] food. Pain is described as sharp, burning, and most intense in the epigastric and supraumbilical region. Reports similar pain when he had diverticulitis. Associated symptoms include nausea and vomiting. Last bm was yesterday evening.  He reports his last episode of diverticulitis was a few months ago.  He has had a lot of minor episodes according to his wife, but then more severe episodes are years apart.  His last colonoscopy was 2018 with Dr. Celestia, noted diverticulosis in the descending and sigmoid colon as well as internal hemorrhoids. He reports some relief with NG tube insertion and describes large volume of output en route [facility] to here.  He is currently having some heartburn. Patient denies tobacco or drug use. Reports drinking 1-2 glasses of wine weekly. Denies use of blood thinners. Patient is a Education officer, community.    ROS: Review of Systems  All other systems reviewed and are negative.   Family History  Problem Relation Age of Onset   CAD Father    CAD Paternal Grandfather    CAD Paternal Uncle    Cancer Mother        age 69    Past Medical History:  Diagnosis Date   Anxiety    Bursitis    right shoulder - cortisone shot by Dr. Melita   Dizziness 02/01/13   Little mild dizziness with no loss of coordination or inability to do things from it.    Elevated BP 02/01/13   Elevated over past few weeks   Pneumonia 2012   right middle lobe   Skin flushed 02/01/13   Sleep deprivation 02/01/13   Under a lot of stress about son who has major liver problems   Sleep difficulties 02/01/13   Under a lot of stress about son who has major liver problems    Past Surgical History:   Procedure Laterality Date   APPENDECTOMY      Social History:  reports that he has never smoked. He does not have any smokeless tobacco history on file. He reports current alcohol use. He reports that he does not use drugs.  Allergies: No Known Allergies  Medications Prior to Admission  Medication Sig Dispense Refill   atorvastatin  (LIPITOR) 40 MG tablet Take 1 tablet (40 mg total) by mouth at bedtime. 90 tablet 0     Physical Exam: Blood pressure (!) 167/92, pulse 91, temperature 98.4 F (36.9 C), temperature source Axillary, resp. rate 18, SpO2 100%. General: Pleasant white male  laying on hospital bed, appears stated age, NAD. HEENT: head -normocephalic, atraumatic; Eyes: PERRLA, no conjunctival injection; anicteric sclerae Neck- Trachea is midline CV- RRR Pulm- breathing is non-labored ORA Abd- soft, mildly distended, mild TTP in the central and upper abdomen without guarding, no peritonitis, no masses, hernias, or organomegaly.  Small bore NG tube appears to be functioning but with minimal, very light output currently GU- deferred  MSK- UE/LE symmetrical, no cyanosis, clubbing, or edema. Neuro-no gross deficit Psych- Alert and Oriented x3 with appropriate affect Skin: warm and dry, no rashes or lesions   Results for orders placed or performed during the hospital encounter of 08/18/23 (from the past 48 hours)  Lipase, blood  Status: None   Collection Time: 08/18/23 10:00 AM  Result Value Ref Range   Lipase 18 11 - 51 U/L    Comment: Performed at [facility], [address] [place], [place] [address]  Comprehensive metabolic panel     Status: Abnormal   Collection Time: 08/18/23 10:00 AM  Result Value Ref Range   Sodium 137 135 - 145 mmol/L   Potassium 4.3 3.5 - 5.1 mmol/L   Chloride 98 98 - 111 mmol/L   CO2 29 22 - 32 mmol/L   Glucose, Bld 144 (H) 70 - 99 mg/dL    Comment: Glucose reference range applies only to samples taken after fasting  for at least 8 hours.   BUN 18 8 - 23 mg/dL   Creatinine, Ser 8.91 0.61 - 1.24 mg/dL   Calcium  9.8 8.9 - 10.3 mg/dL   Total Protein 7.3 6.5 - 8.1 g/dL   Albumin 4.5 3.5 - 5.0 g/dL   AST 21 15 - 41 U/L   ALT 14 0 - 44 U/L   Alkaline Phosphatase 56 38 - 126 U/L   Total Bilirubin 1.5 (H) 0.0 - 1.2 mg/dL   GFR, Estimated >39 >39 mL/min    Comment: (NOTE) Calculated using the CKD-EPI Creatinine Equation (2021)    Anion gap 11 5 - 15    Comment: Performed at [facility], [address] [place], [place] [address]  CBC     Status: Abnormal   Collection Time: 08/18/23 10:00 AM  Result Value Ref Range   WBC 13.4 (H) 4.0 - 10.5 K/uL   RBC 6.14 (H) 4.22 - 5.81 MIL/uL   Hemoglobin 17.2 (H) 13.0 - 17.0 g/dL   HCT 48.7 60.9 - 47.9 %   MCV 83.4 80.0 - 100.0 fL   MCH 28.0 26.0 - 34.0 pg   MCHC 33.6 30.0 - 36.0 g/dL   RDW 86.6 88.4 - 84.4 %   Platelets 256 150 - 400 K/uL   nRBC 0.0 0.0 - 0.2 %    Comment: Performed at [facility], [address] [place], [place] [address]   DG Abd Portable 1 View Result Date: 08/18/2023 CLINICAL DATA:  NG Tube EXAM: PORTABLE ABDOMEN - 1 VIEW COMPARISON:  August 18, 2023 CT of the abdomen and pelvis FINDINGS: Abnormal gaseous distension of the small bowel in the upper abdomen consistent with patient's small-bowel obstruction noted on the prior CT. Air-fluid level in the stomach. Esophagogastric tube terminates in the stomach. No pneumoperitoneum. No organomegaly or radiopaque calculi. No acute fracture or destructive lesion. Lower lung volumes. No focal airspace consolidation or pleural effusion. IMPRESSION: Well-positioned esophagogastric tube, terminating in the stomach. Electronically Signed   By: Rogelia Myers M.D.   On: 08/18/2023 17:13   CT ABDOMEN PELVIS W CONTRAST Result Date: 08/18/2023 CLINICAL DATA:  Central and left lower quadrant abdominal pain beginning yesterday. Previous diverticulitis. EXAM: CT ABDOMEN AND  PELVIS WITH CONTRAST TECHNIQUE: Multidetector CT imaging of the abdomen and pelvis was performed using the standard protocol following bolus administration of intravenous contrast. RADIATION DOSE REDUCTION: This exam was performed according to the departmental dose-optimization program which includes automated exposure control, adjustment of the mA and/or kV according to patient size and/or use of iterative reconstruction technique. CONTRAST:  OMNIPAQUE  IOHEXOL  300 MG/ML  SOLN COMPARISON:  07/28/2015 FINDINGS: Lower Chest: No acute findings. Hepatobiliary: No suspicious hepatic masses identified. Tiny gallstones noted, without signs of cholecystitis or biliary ductal dilatation. Pancreas:  No mass or inflammatory  changes. Spleen: Within normal limits in size and appearance. Adrenals/Urinary Tract: No suspicious masses identified. No evidence of ureteral calculi or hydronephrosis. Unremarkable unopacified urinary bladder. Stomach/Bowel: Small hiatal hernia noted. Moderately dilated fluid-filled small bowel loops are seen in the left abdomen and pelvis with transition point to nondilated distal small bowel seen in the right pelvis adjacent to iliac vessels. This is consistent a partial small bowel obstruction, likely due to adhesion. No No evidence of acute inflammatory process or abscess. Diverticulosis is seen mainly involving the sigmoid colon, however there is no evidence of diverticulitis. Vascular/Lymphatic: No pathologically enlarged lymph nodes. No acute vascular findings. Reproductive:  No mass or other significant abnormality. Other:  None. Musculoskeletal:  No suspicious bone lesions identified. IMPRESSION: Partial distal small bowel obstruction with transition point in left pelvis, likely due to adhesion. Small hiatal hernia. Colonic diverticulosis, without radiographic evidence of diverticulitis. Cholelithiasis. No radiographic evidence of cholecystitis. Electronically Signed   By: Norleen DELENA Kil M.D.    On: 08/18/2023 11:53      Assessment/Plan SBO, possibly due to intra-abdominal adhesions from previous surgery versus diverticulitis  68 y/o M who's history, labs, and imaging have all been reviewed. Patient is non-toxic appearing and hemodynamically stable. WBC 13.4. hgb 17.2.  Likely hemoconcentrated.  CMP overall unremarkable other than total bilirubin of 1.5. CT scan of the abdomen and pelvis shows SBO with a transition point in the LLQ. No radiographic evidence of inflammatory process or bowel ischemia. No peritonitis on abdominal exam. No emergent surgical needs. Recommend NG tube decompression and SBO protocol with gastrografin . CCS will follow.  Will start on IVF. Pain/nausea control. Initiate SBO protocol tomorrow morning after tube has had time to decompress.  I reviewed his NG placement xray- while the tip terminates in the stomach I think the sideport is in esophagus and recommend advancing tube 7-8cm. Securechat sent to pt's RN.  FEN - NPO, IVF per priamry, NG to LIWS VTE - SCD's, ok for DVT ppx with lovenox  or SQH ID - None inidcated Admit - TRH service    I reviewed nursing notes, ED provider notes, hospitalist notes, last 24 h vitals and pain scores, last 48 h intake and output, last 24 h labs and trends, and last 24 h imaging results.  Mitzie DELENA Freund, MD [facility] 08/18/2023, 6:05 PM Please see Amion for pager number during day hours 7:00am-4:30pm or 7:00am -11:30am on weekends

## 2023-08-18 NOTE — ED Notes (Signed)
Pt states he is unable to provide urine sample. 

## 2023-08-18 NOTE — ED Provider Notes (Signed)
 [place] EMERGENCY DEPARTMENT AT [facility] Provider Note   CSN: [id number] Arrival date & time: 08/18/23  0945     Patient presents with: Abdominal Pain   Darren Hess is a 68 y.o. male.   Patient complains of lower abdominal pain.  Patient reports he has a history of diverticulitis.  Patient reports pain began yesterday.  He states that started after eating some hot [place] food and he thought maybe it was the foo  Patient reports pain has remained persistent.  Patient reports nausea and vomiting.  He denies any diarrhea.  He has not seen any blood or black stool.  Patient states he has felt like he has had a fever.  Patient denies any chest pain denies any back pain.  The history is provided by the patient. No language interpreter was used.  Abdominal Pain Pain location:  LLQ and LUQ Pain quality: aching   Pain radiates to:  Does not radiate Pain severity:  Moderate Onset quality:  Sudden Duration:  1 day Progression:  Worsening      Prior to Admission medications   Medication Sig Start Date End Date Taking? Authorizing Provider  atorvastatin  (LIPITOR) 40 MG tablet Take 1 tablet (40 mg total) by mouth at bedtime. 08/27/22   Laurence Locus, DO    Allergies: Patient has no known allergies.    Review of Systems  Gastrointestinal:  Positive for abdominal pain.  All other systems reviewed and are negative.   Updated Vital Signs BP (!) 171/97 (BP Location: Right Arm)   Pulse 94   Temp 98 F (36.7 C)   Resp 14   SpO2 97%   Physical Exam Vitals and nursing note reviewed.  Constitutional:      Appearance: He is well-developed.  HENT:     Head: Normocephalic.  Cardiovascular:     Rate and Rhythm: Normal rate and regular rhythm.  Pulmonary:     Effort: Pulmonary effort is normal.     Breath sounds: Normal breath sounds.  Abdominal:     General: Bowel sounds are increased. There is no distension.     Tenderness: There is abdominal tenderness in the left  lower quadrant.  Musculoskeletal:        General: Normal range of motion.     Cervical back: Normal range of motion.  Skin:    General: Skin is warm.  Neurological:     General: No focal deficit present.     Mental Status: He is alert and oriented to person, place, and time.     (all labs ordered are listed, but only abnormal results are displayed) Labs Reviewed  COMPREHENSIVE METABOLIC PANEL WITH GFR - Abnormal; Notable for the following components:      Result Value   Glucose, Bld 144 (*)    Total Bilirubin 1.5 (*)    All other components within normal limits  CBC - Abnormal; Notable for the following components:   WBC 13.4 (*)    RBC 6.14 (*)    Hemoglobin 17.2 (*)    All other components within normal limits  LIPASE, BLOOD  URINALYSIS, ROUTINE W REFLEX MICROSCOPIC    EKG: None  Radiology: CT ABDOMEN PELVIS W CONTRAST Result Date: 08/18/2023 CLINICAL DATA:  Central and left lower quadrant abdominal pain beginning yesterday. Previous diverticulitis. EXAM: CT ABDOMEN AND PELVIS WITH CONTRAST TECHNIQUE: Multidetector CT imaging of the abdomen and pelvis was performed using the standard protocol following bolus administration of intravenous contrast. RADIATION DOSE REDUCTION: This exam  was performed according to the departmental dose-optimization program which includes automated exposure control, adjustment of the mA and/or kV according to patient size and/or use of iterative reconstruction technique. CONTRAST:  OMNIPAQUE  IOHEXOL  300 MG/ML  SOLN COMPARISON:  07/28/2015 FINDINGS: Lower Chest: No acute findings. Hepatobiliary: No suspicious hepatic masses identified. Tiny gallstones noted, without signs of cholecystitis or biliary ductal dilatation. Pancreas:  No mass or inflammatory changes. Spleen: Within normal limits in size and appearance. Adrenals/Urinary Tract: No suspicious masses identified. No evidence of ureteral calculi or hydronephrosis. Unremarkable unopacified  urinary bladder. Stomach/Bowel: Small hiatal hernia noted. Moderately dilated fluid-filled small bowel loops are seen in the left abdomen and pelvis with transition point to nondilated distal small bowel seen in the right pelvis adjacent to iliac vessels. This is consistent a partial small bowel obstruction, likely due to adhesion. No No evidence of acute inflammatory process or abscess. Diverticulosis is seen mainly involving the sigmoid colon, however there is no evidence of diverticulitis. Vascular/Lymphatic: No pathologically enlarged lymph nodes. No acute vascular findings. Reproductive:  No mass or other significant abnormality. Other:  None. Musculoskeletal:  No suspicious bone lesions identified. IMPRESSION: Partial distal small bowel obstruction with transition point in left pelvis, likely due to adhesion. Small hiatal hernia. Colonic diverticulosis, without radiographic evidence of diverticulitis. Cholelithiasis. No radiographic evidence of cholecystitis. Electronically Signed   By: Norleen DELENA Kil M.D.   On: 08/18/2023 11:53     Procedures   Medications Ordered in the ED  HYDROmorphone  (DILAUDID ) injection 0.5 mg (has no administration in time range)  ondansetron  (ZOFRAN ) injection 4 mg (4 mg Intravenous Given 08/18/23 1059)  iohexol  (OMNIPAQUE ) 300 MG/ML solution 100 mL (100 mLs Intravenous Contrast Given 08/18/23 1122)                                    Medical Decision Making Patient complains of left lower quadrant abdominal pain.  Amount and/or Complexity of Data Reviewed Independent Historian: spouse    Details: Patient is here with his wife who is supportive Labs: ordered. Decision-making details documented in ED Course.    Details: Labs ordered reviewed and interpreted patient has an elevated white blood cell count of 13.4. Radiology: ordered and independent interpretation performed. Decision-making details documented in ED Course.    Details: CT abdomen shows partial small bowel  obstruction likely due to adhesion  Discussion of management or test interpretation with external provider(s): General surgery consulted Hospitalist consulted for admission.    Risk Prescription drug management. Decision regarding hospitalization. Risk Details: Pt given zofran  and dilaudid  for pain.          Final diagnoses:  Partial small bowel obstruction [facility])    ED Discharge Orders     None          Flint Sonny MARLA DEVONNA 08/18/23 1537    Dreama Longs, MD 08/18/23 2329

## 2023-08-18 NOTE — ED Notes (Signed)
 [facility] is otw to transport patient to [facility] rm# [id number]

## 2023-08-18 NOTE — H&P (Signed)
 History and Physical    Darren Hess FMW:[id number] DOB: [birth year] DOA: 08/18/2023  PCP: Onita Rush, MD  Patient coming from: Home  I have personally briefly reviewed patient's old medical records in [facility]  Chief Complaint: Abdominal pain  HPI: Darren Hess is a 68 y.o. male with medical history significant for TIA, HLD, diverticulitis who presents to the ED for evaluation of abdominal pain.  Patient reports developing upper abdominal pain beginning 9 of 7/6.  He initially thought he was having recurrent diverticulitis which usually affects him in the left lower quadrant however this pain was localized to his upper abdomen.  He says pain was very sharp as if someone was stabbing him.  He had associated nausea and vomiting.  Last bowel movement was yesterday evening.  He has an NG tube in place now and does have relief in pain, nausea, and vomiting.  [facility] ED Course  Labs/Imaging on admission: I have personally reviewed following labs and imaging studies.  Initial vitals showed BP 150/103, pulse 95, RR 16, temp 98.0 F, SpO2 99% on room air.  Labs show WBC 13.4, hemoglobin 17.2, platelets 256, sodium 137, potassium 4.3, bicarb 29, BUN 18, creatinine 1.08, serum glucose 144, AST 21, ALT 14, alk phos 56, total bilirubin 1.5, lipase 18.  CT A/P with contrast showed partially distal small bowel obstruction with transition point in the left pelvis, likely due to adhesion.  Small hiatal hernia.  Colonic diverticulosis without diverticulitis.  Cholelithiasis without cholecystitis.  Patient was given 5 cc normal saline.  NG tube was placed.  General surgery consulted and recommended medical admission.  The hospitalist service was consulted to admit.  Review of Systems: All systems reviewed and are negative except as documented in history of present illness above.   Past Medical History:  Diagnosis Date   Anxiety    Bursitis    right shoulder - cortisone  shot by Dr. Melita   Dizziness 02/01/13   Little mild dizziness with no loss of coordination or inability to do things from it.    Elevated BP 02/01/13   Elevated over past few weeks   Pneumonia 2012   right middle lobe   Skin flushed 02/01/13   Sleep deprivation 02/01/13   Under a lot of stress about son who has major liver problems   Sleep difficulties 02/01/13   Under a lot of stress about son who has major liver problems    Past Surgical History:  Procedure Laterality Date   APPENDECTOMY      Social History: Social History   Tobacco Use   Smoking status: Never  Substance Use Topics   Alcohol use: Yes    Comment: 1 glass of wine with dinner about twice per week   Drug use: No   No Known Allergies  Family History  Problem Relation Age of Onset   CAD Father    CAD Paternal Grandfather    CAD Paternal Uncle    Cancer Mother        age 19     Prior to Admission medications   Medication Sig Start Date End Date Taking? Authorizing Provider  atorvastatin  (LIPITOR) 40 MG tablet Take 1 tablet (40 mg total) by mouth at bedtime. 08/27/22   Laurence Locus, DO    Physical Exam: Vitals:   08/18/23 1600 08/18/23 1630 08/18/23 1745 08/18/23 1959  BP: (!) 155/75 (!) 145/74 (!) 167/92 (!) 152/79  Pulse: 86 95 91 81  Resp:  18 16  Temp:   98.4 F (36.9 C) 98.2 F (36.8 C)  TempSrc:   Axillary Oral  SpO2: 99% 97% 100% 100%   Constitutional: Resting in bed, NAD, calm, comfortable Eyes: EOMI, lids and conjunctivae normal ENMT: NG tube in place with dark brown output.  Mucous membranes are moist. Posterior pharynx clear of any exudate or lesions.Normal dentition.  Neck: normal, supple, no masses. Respiratory: clear to auscultation bilaterally, no wheezing, no crackles. Normal respiratory effort. No accessory muscle use.  Cardiovascular: Regular rate and rhythm, no murmurs / rubs / gallops. No extremity edema. 2+ pedal pulses. Abdomen: Epigastric tenderness, no masses  palpated. Musculoskeletal: no clubbing / cyanosis. No joint deformity upper and lower extremities. Good ROM, no contractures. Normal muscle tone.  Skin: no rashes, lesions, ulcers. No induration Neurologic: Sensation intact. Strength 5/5 in all 4.  Psychiatric: Normal judgment and insight. Alert and oriented x 3. Normal mood.   EKG: Not performed.  Assessment/Plan Principal Problem:   Small bowel obstruction due to adhesions [facility]) Active Problems:   Hyperlipidemia   Leukocytosis   Dehydration   History of TIA (transient ischemic attack)   Darren Hess is a 68 y.o. male with medical history significant for TIA, HLD, diverticulitis who is admitted with small bowel obstruction likely due to adhesions.  General surgery following.  Assessment and Plan: Small bowel obstruction due to intra-abdominal adhesions: - General Surgery following - Keep n.p.o., continue IV fluid hydration - NG tube in place to LIWS - Continue analgesics and antiemetics as needed  History of TIA/hyperlipidemia: Patient states he is no longer taking aspirin  or statin.  Leukocytosis: Likely reactive versus some component of hemoconcentration.  Continue to monitor.   DVT prophylaxis: enoxaparin  (LOVENOX ) injection 40 mg Start: 08/18/23 2200 Code Status: Full code, confirmed with patient on admission Family Communication: Spouse at bedside Disposition Plan: From home, dispo pending clinical progress Consults called: General Surgery Severity of Illness: The appropriate patient status for this patient is OBSERVATION. Observation status is judged to be reasonable and necessary in order to provide the required intensity of service to ensure the patient's safety. The patient's presenting symptoms, physical exam findings, and initial radiographic and laboratory data in the context of their medical condition is felt to place them at decreased risk for further clinical deterioration. Furthermore, it is anticipated that  the patient will be medically stable for discharge from the hospital within 2 midnights of admission.   Jorie Blanch MD [facility]  If 7PM-7AM, please contact night-coverage [email or web address]  08/18/2023, 10:16 PM

## 2023-08-19 ENCOUNTER — Inpatient Hospital Stay (HOSPITAL_COMMUNITY)

## 2023-08-19 DIAGNOSIS — K449 Diaphragmatic hernia without obstruction or gangrene: Secondary | ICD-10-CM | POA: Diagnosis not present

## 2023-08-19 DIAGNOSIS — K56609 Unspecified intestinal obstruction, unspecified as to partial versus complete obstruction: Secondary | ICD-10-CM | POA: Diagnosis present

## 2023-08-19 DIAGNOSIS — D72829 Elevated white blood cell count, unspecified: Secondary | ICD-10-CM | POA: Diagnosis not present

## 2023-08-19 DIAGNOSIS — K5651 Intestinal adhesions [bands], with partial obstruction: Secondary | ICD-10-CM | POA: Diagnosis not present

## 2023-08-19 DIAGNOSIS — Z79899 Other long term (current) drug therapy: Secondary | ICD-10-CM | POA: Diagnosis not present

## 2023-08-19 DIAGNOSIS — Z8701 Personal history of pneumonia (recurrent): Secondary | ICD-10-CM | POA: Diagnosis not present

## 2023-08-19 DIAGNOSIS — K8020 Calculus of gallbladder without cholecystitis without obstruction: Secondary | ICD-10-CM | POA: Diagnosis not present

## 2023-08-19 DIAGNOSIS — Z8673 Personal history of transient ischemic attack (TIA), and cerebral infarction without residual deficits: Secondary | ICD-10-CM | POA: Diagnosis not present

## 2023-08-19 DIAGNOSIS — E860 Dehydration: Secondary | ICD-10-CM | POA: Diagnosis not present

## 2023-08-19 DIAGNOSIS — Z809 Family history of malignant neoplasm, unspecified: Secondary | ICD-10-CM | POA: Diagnosis not present

## 2023-08-19 DIAGNOSIS — K5669 Other partial intestinal obstruction: Secondary | ICD-10-CM | POA: Diagnosis not present

## 2023-08-19 DIAGNOSIS — Z8249 Family history of ischemic heart disease and other diseases of the circulatory system: Secondary | ICD-10-CM | POA: Diagnosis not present

## 2023-08-19 DIAGNOSIS — E785 Hyperlipidemia, unspecified: Secondary | ICD-10-CM | POA: Diagnosis not present

## 2023-08-19 DIAGNOSIS — R1011 Right upper quadrant pain: Secondary | ICD-10-CM | POA: Diagnosis not present

## 2023-08-19 DIAGNOSIS — K565 Intestinal adhesions [bands], unspecified as to partial versus complete obstruction: Secondary | ICD-10-CM | POA: Diagnosis not present

## 2023-08-19 DIAGNOSIS — R12 Heartburn: Secondary | ICD-10-CM | POA: Diagnosis not present

## 2023-08-19 LAB — BASIC METABOLIC PANEL WITH GFR
Anion gap: 6 (ref 5–15)
BUN: 18 mg/dL (ref 8–23)
CO2: 29 mmol/L (ref 22–32)
Calcium: 8.1 mg/dL — ABNORMAL LOW (ref 8.9–10.3)
Chloride: 104 mmol/L (ref 98–111)
Creatinine, Ser: 1.18 mg/dL (ref 0.61–1.24)
GFR, Estimated: >60 mL/min (ref >60)
Glucose, Bld: 103 mg/dL — ABNORMAL HIGH (ref 70–99)
Potassium: 3.7 mmol/L (ref 3.5–5.1)
Sodium: 139 mmol/L (ref 135–145)

## 2023-08-19 LAB — MAGNESIUM: Magnesium: 1.9 mg/dL (ref 1.7–2.4)

## 2023-08-19 LAB — CBC
HCT: 42.1 % (ref 39.0–52.0)
Hemoglobin: 14.1 g/dL (ref 13.0–17.0)
MCH: 27.8 pg (ref 26.0–34.0)
MCHC: 33.5 g/dL (ref 30.0–36.0)
MCV: 82.9 fL (ref 80.0–100.0)
Platelets: 176 K/uL (ref 150–400)
RBC: 5.08 MIL/uL (ref 4.22–5.81)
RDW: 13.4 % (ref 11.5–15.5)
WBC: 5.8 K/uL (ref 4.0–10.5)
nRBC: 0 % (ref 0.0–0.2)

## 2023-08-19 MED ORDER — DIATRIZOATE MEGLUMINE & SODIUM 66-10 % PO SOLN
90.0000 mL | Freq: Once | ORAL | Status: AC
  Administered 2023-08-19: 90 mL via NASOGASTRIC
  Filled 2023-08-19: qty 90

## 2023-08-19 NOTE — Progress Notes (Signed)
 [facility] Progress Note     Subjective: CC:  Abdominal pain resolved s/p NGT placement. Reports flatus. Denies BM.  States that he has nearly filled one NGT cannister, at least 4/5ths full, and the one on the wall at present is new.  Objective: Vital signs in last 24 hours: Temp:  [97.9 F (36.6 C)-98.5 F (36.9 C)] 97.9 F (36.6 C) (07/08 0320) Pulse Rate:  [76-101] 76 (07/08 0320) Resp:  [14-18] 17 (07/07 2353) BP: (129-171)/(73-103) 153/73 (07/08 0320) SpO2:  [96 %-100 %] 99 % (07/08 0320)    Intake/Output from previous day: 07/07 0701 - 07/08 0700 In: 1955.2 [I.V.:1454.4; IV Piggyback:500.8] Out: 350 [Emesis/NG output:350] Intake/Output this shift: No intake/output data recorded.  PE: Gen:  Alert, NAD, pleasant Card:  Regular rate and rhythm, no lower extremity edema  Pulm:  Normal effort ORA Abd: Soft, non-tender, non-distended    NG- 350 mL documented in epic, per patient report more like 850 mL. Skin: warm and dry, no rashes  Psych: A&Ox3   Lab Results:  Recent Labs    08/18/23 1000 08/19/23 0710  WBC 13.4* 5.8  HGB 17.2* 14.1  HCT 51.2 42.1  PLT 256 176   BMET Recent Labs    08/18/23 1000 08/19/23 0710  NA 137 139  K 4.3 3.7  CL 98 104  CO2 29 29  GLUCOSE 144* 103*  BUN 18 18  CREATININE 1.08 1.18  CALCIUM  9.8 8.1*   PT/INR No results for input(s): LABPROT, INR in the last 72 hours. CMP     Component Value Date/Time   NA 139 08/19/2023 0710   K 3.7 08/19/2023 0710   CL 104 08/19/2023 0710   CO2 29 08/19/2023 0710   GLUCOSE 103 (H) 08/19/2023 0710   BUN 18 08/19/2023 0710   CREATININE 1.18 08/19/2023 0710   CREATININE 1.09 02/01/2013 1804   CALCIUM  8.1 (L) 08/19/2023 0710   PROT 7.3 08/18/2023 1000   ALBUMIN 4.5 08/18/2023 1000   AST 21 08/18/2023 1000   ALT 14 08/18/2023 1000   ALKPHOS 56 08/18/2023 1000   BILITOT 1.5 (H) 08/18/2023 1000   GFRNONAA >60 08/19/2023 0710   Lipase     Component Value Date/Time    LIPASE 18 08/18/2023 1000       Studies/Results: DG CHEST PORT 1 VIEW Result Date: 08/18/2023 CLINICAL DATA:  NG tube placement EXAM: PORTABLE CHEST 1 VIEW COMPARISON:  08/19/2012, CT 08/18/2023 FINDINGS: Enteric tube tip at the GE junction region, probably within small hiatal hernia. Mild air distension of upper bowel loops. Diffuse bronchitic changes. Vague slightly rounded left upper lung opacity measuring 19 mm, possible lung nodule IMPRESSION: 1. Enteric tube tip at the GE junction region, probably within small hiatal hernia. Consider further advancement for more optimal positioning 2. Diffuse bronchitic changes. Vague slightly rounded left upper lung opacity, possible lung nodule. Chest CT is recommended for further evaluation Electronically Signed   By: Luke Bun M.D.   On: 08/18/2023 22:13   DG Abd Portable 1 View Result Date: 08/18/2023 CLINICAL DATA:  NG Tube EXAM: PORTABLE ABDOMEN - 1 VIEW COMPARISON:  August 18, 2023 CT of the abdomen and pelvis FINDINGS: Abnormal gaseous distension of the small bowel in the upper abdomen consistent with patient's small-bowel obstruction noted on the prior CT. Air-fluid level in the stomach. Esophagogastric tube terminates in the stomach. No pneumoperitoneum. No organomegaly or radiopaque calculi. No acute fracture or destructive lesion. Lower lung volumes. No focal airspace consolidation or pleural  effusion. IMPRESSION: Well-positioned esophagogastric tube, terminating in the stomach. Electronically Signed   By: Rogelia Myers M.D.   On: 08/18/2023 17:13   CT ABDOMEN PELVIS W CONTRAST Result Date: 08/18/2023 CLINICAL DATA:  Central and left lower quadrant abdominal pain beginning yesterday. Previous diverticulitis. EXAM: CT ABDOMEN AND PELVIS WITH CONTRAST TECHNIQUE: Multidetector CT imaging of the abdomen and pelvis was performed using the standard protocol following bolus administration of intravenous contrast. RADIATION DOSE REDUCTION: This exam was  performed according to the departmental dose-optimization program which includes automated exposure control, adjustment of the mA and/or kV according to patient size and/or use of iterative reconstruction technique. CONTRAST:  OMNIPAQUE  IOHEXOL  300 MG/ML  SOLN COMPARISON:  07/28/2015 FINDINGS: Lower Chest: No acute findings. Hepatobiliary: No suspicious hepatic masses identified. Tiny gallstones noted, without signs of cholecystitis or biliary ductal dilatation. Pancreas:  No mass or inflammatory changes. Spleen: Within normal limits in size and appearance. Adrenals/Urinary Tract: No suspicious masses identified. No evidence of ureteral calculi or hydronephrosis. Unremarkable unopacified urinary bladder. Stomach/Bowel: Small hiatal hernia noted. Moderately dilated fluid-filled small bowel loops are seen in the left abdomen and pelvis with transition point to nondilated distal small bowel seen in the right pelvis adjacent to iliac vessels. This is consistent a partial small bowel obstruction, likely due to adhesion. No No evidence of acute inflammatory process or abscess. Diverticulosis is seen mainly involving the sigmoid colon, however there is no evidence of diverticulitis. Vascular/Lymphatic: No pathologically enlarged lymph nodes. No acute vascular findings. Reproductive:  No mass or other significant abnormality. Other:  None. Musculoskeletal:  No suspicious bone lesions identified. IMPRESSION: Partial distal small bowel obstruction with transition point in left pelvis, likely due to adhesion. Small hiatal hernia. Colonic diverticulosis, without radiographic evidence of diverticulitis. Cholelithiasis. No radiographic evidence of cholecystitis. Electronically Signed   By: Norleen DELENA Kil M.D.   On: 08/18/2023 11:53    Anti-infectives: Anti-infectives (From admission, onward)    None        Assessment/Plan  SBO, possibly due to intra-abdominal adhesions from previous surgery versus diverticulitis    - afebrile, hemodynamically stable, no emergent need for surgery - continue NG tube and SBO protocol ordered this morning. Clinically he is partially obstructed, having flatus.   FEN - NPO, IVF per priamry, NG to LIWS VTE - SCD's, Lovenox   ID - None inidcated Admit - TRH service    LOS: 0 days   I reviewed nursing notes, hospitalist notes, last 24 h vitals and pain scores, last 48 h intake and output, last 24 h labs and trends, and last 24 h imaging results.  This care required moderate level of medical decision making.   Almarie Pringle, PA-C [facility] Please see Amion for pager number during day hours 7:00am-4:30pm

## 2023-08-19 NOTE — TOC Initial Note (Addendum)
 Transition of Care [facility]) - Initial/Assessment Note    Patient Details  Name: Darren Hess MRN: [id number] Date of Birth: 1956/01/15  [facility] of [facility]) CM/SW Contact:    Nola Devere Hands, RN Phone Number: 08/19/2023, 2:00 PM  Clinical Narrative:                 68 yr old gentleman from home with wife.Independent prior to admission.  Adm with SBO due to adhesions per notes. Patient has NGT, IVF and antiemetics. TOC Team will continue to follow.        Patient Goals and CMS Choice            Expected Discharge Plan and Services                                              Prior Living Arrangements/Services                       Activities of Daily Living   ADL Screening (condition at time of admission) Independently performs ADLs?: Yes (appropriate for developmental age) Is the patient deaf or have difficulty hearing?: No Does the patient have difficulty seeing, even when wearing glasses/contacts?: No Does the patient have difficulty concentrating, remembering, or making decisions?: No  Permission Sought/Granted                  Emotional Assessment              Admission diagnosis:  SBO (small bowel obstruction) (HCC) [K56.609] Partial small bowel obstruction (HCC) [K56.600] Patient Active Problem List   Diagnosis Date Noted   SBO (small bowel obstruction) (HCC) 08/19/2023   Small bowel obstruction due to adhesions (HCC) 08/18/2023   Leukocytosis 08/18/2023   Dehydration 08/18/2023   History of TIA (transient ischemic attack) 08/18/2023   Hyperlipidemia 08/27/2022   Palpitation 08/27/2022   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/27/2022   TIA (transient ischemic attack) 08/26/2022   Elevated blood pressure 04/04/2013   Fatigue 04/04/2013   PCP:  Onita Rush, MD Pharmacy:   [facility] [id number] - [address], [place] - [address] [facility] [address] [place]  [address] Phone: [phone] Fax: [phone]     Social Drivers of Health (SDOH) Social History: SDOH Screenings   Food Insecurity: No Food Insecurity (08/18/2023)  Housing: Low Risk  (08/18/2023)  Transportation Needs: No Transportation Needs (08/18/2023)  Utilities: Not At Risk (08/18/2023)  Social Connections: Socially Integrated (08/18/2023)  Tobacco Use: Unknown (08/18/2023)   SDOH Interventions:     Readmission Risk Interventions     No data to display

## 2023-08-19 NOTE — Progress Notes (Addendum)
 PROGRESS NOTE  Darren Hess  FMW:[id number] DOB: 1955/09/08 DOA: 08/18/2023 PCP: Onita Rush, MD   Brief Narrative: Patient is a 68 year old male with history of TIA, hyperlipidemia, diverticulitis who presented to the emergency department with complaint of abdominal pain was started on 7/6.  Pain was described as sharp and stabbing in nature.  History of nausea and vomiting.  On presentation, he was mildly hypertensive but otherwise hemodynamically stable.  Lab work showed WBC count of 13.4, potassium of 4.3, lipase of 18.  CT abdomen/pelvis with contrast showed partial distal small bowel obstruction with transition point in the left pelvis likely due to adhesions, small hiatal hernia, colonic diverticulosis without diverticulitis, cholelithiasis without cholecystitis.  Patient admitted for the management of small bowel obstruction.  NG tube placed.  General surgery following.  Currently on conservative management.  Clinically improving  Assessment & Plan:  Principal Problem:   Small bowel obstruction due to adhesions [facility]) Active Problems:   Hyperlipidemia   Leukocytosis   Dehydration   History of TIA (transient ischemic attack)  SBO: Presented with abdominal pain, nausea, vomiting.  CT imaging suggestive of SBO.  Likely from adhesions.  Currently NPO.  General surgery following.  NG tube placed.  Continue gentle IV fluids, antiemetics, analgesics. This morning, abdomen is soft and nontender.  Denies abdominal pain.  Has good bowel sounds.  Passing gas.  NG tube draining bilious fluid  History of TIA/hyperlipidemia: Not on statin or aspirin  at home  Leukocytosis: Most likely reactive.Now resolved       DVT prophylaxis:enoxaparin  (LOVENOX ) injection 40 mg Start: 08/18/23 2200     Code Status: Full Code  Family Communication: Discussed with wife at bedside on 7/8  Patient status: Inpatient  Patient is from : Home  Anticipated discharge to: Home  Estimated DC date:  Tomorrow   Consultants:   Procedures:  Antimicrobials:  Anti-infectives (From admission, onward)    None       Subjective: Patient seen and examined at bedside today.  Hemodynamically stable.  He looks comfortable this morning.  NG tube draining bilious fluid.  Denies any abdomen pain.  Abhd is soft and nontender with good bowel function this morning  Objective: Vitals:   08/18/23 1959 08/18/23 2353 08/19/23 0320 08/19/23 0807  BP: (!) 152/79 129/73 (!) 153/73 (!) 141/76  Pulse: 81 79 76 74  Resp: 16 17  17   Temp: 98.2 F (36.8 C) 98.5 F (36.9 C) 97.9 F (36.6 C) 97.6 F (36.4 C)  TempSrc: Oral Oral Oral Oral  SpO2: 100% 100% 99% 96%    Intake/Output Summary (Last 24 hours) at 08/19/2023 9177 Last data filed at 08/19/2023 0600 Gross per 24 hour  Intake 1955.22 ml  Output 350 ml  Net 1605.22 ml   There were no vitals filed for this visit.  Examination:  General exam: Overall comfortable, not in distress HEENT: PERRL, NG tube Respiratory system:  no wheezes or crackles  Cardiovascular system: S1 & S2 heard, RRR.  Gastrointestinal system: Abdomen is distended, soft and nontender.  Bowel sounds present Central nervous system: Alert and oriented Extremities: No edema, no clubbing ,no cyanosis Skin: No rashes, no ulcers,no icterus     Data Reviewed: I have personally reviewed following labs and imaging studies  CBC: Recent Labs  Lab 08/18/23 1000 08/19/23 0710  WBC 13.4* 5.8  HGB 17.2* 14.1  HCT 51.2 42.1  MCV 83.4 82.9  PLT 256 176   Basic Metabolic Panel: Recent Labs  Lab 08/18/23 1000 08/19/23  0710  NA 137 139  K 4.3 3.7  CL 98 104  CO2 29 29  GLUCOSE 144* 103*  BUN 18 18  CREATININE 1.08 1.18  CALCIUM  9.8 8.1*  MG  --  1.9     No results found for this or any previous visit (from the past 240 hours).   Radiology Studies: DG CHEST PORT 1 VIEW Result Date: 08/18/2023 CLINICAL DATA:  NG tube placement EXAM: PORTABLE CHEST 1 VIEW  COMPARISON:  08/19/2012, CT 08/18/2023 FINDINGS: Enteric tube tip at the GE junction region, probably within small hiatal hernia. Mild air distension of upper bowel loops. Diffuse bronchitic changes. Vague slightly rounded left upper lung opacity measuring 19 mm, possible lung nodule IMPRESSION: 1. Enteric tube tip at the GE junction region, probably within small hiatal hernia. Consider further advancement for more optimal positioning 2. Diffuse bronchitic changes. Vague slightly rounded left upper lung opacity, possible lung nodule. Chest CT is recommended for further evaluation Electronically Signed   By: Luke Bun M.D.   On: 08/18/2023 22:13   DG Abd Portable 1 View Result Date: 08/18/2023 CLINICAL DATA:  NG Tube EXAM: PORTABLE ABDOMEN - 1 VIEW COMPARISON:  August 18, 2023 CT of the abdomen and pelvis FINDINGS: Abnormal gaseous distension of the small bowel in the upper abdomen consistent with patient's small-bowel obstruction noted on the prior CT. Air-fluid level in the stomach. Esophagogastric tube terminates in the stomach. No pneumoperitoneum. No organomegaly or radiopaque calculi. No acute fracture or destructive lesion. Lower lung volumes. No focal airspace consolidation or pleural effusion. IMPRESSION: Well-positioned esophagogastric tube, terminating in the stomach. Electronically Signed   By: Rogelia Myers M.D.   On: 08/18/2023 17:13   CT ABDOMEN PELVIS W CONTRAST Result Date: 08/18/2023 CLINICAL DATA:  Central and left lower quadrant abdominal pain beginning yesterday. Previous diverticulitis. EXAM: CT ABDOMEN AND PELVIS WITH CONTRAST TECHNIQUE: Multidetector CT imaging of the abdomen and pelvis was performed using the standard protocol following bolus administration of intravenous contrast. RADIATION DOSE REDUCTION: This exam was performed according to the departmental dose-optimization program which includes automated exposure control, adjustment of the mA and/or kV according to patient size  and/or use of iterative reconstruction technique. CONTRAST:  OMNIPAQUE  IOHEXOL  300 MG/ML  SOLN COMPARISON:  07/28/2015 FINDINGS: Lower Chest: No acute findings. Hepatobiliary: No suspicious hepatic masses identified. Tiny gallstones noted, without signs of cholecystitis or biliary ductal dilatation. Pancreas:  No mass or inflammatory changes. Spleen: Within normal limits in size and appearance. Adrenals/Urinary Tract: No suspicious masses identified. No evidence of ureteral calculi or hydronephrosis. Unremarkable unopacified urinary bladder. Stomach/Bowel: Small hiatal hernia noted. Moderately dilated fluid-filled small bowel loops are seen in the left abdomen and pelvis with transition point to nondilated distal small bowel seen in the right pelvis adjacent to iliac vessels. This is consistent a partial small bowel obstruction, likely due to adhesion. No No evidence of acute inflammatory process or abscess. Diverticulosis is seen mainly involving the sigmoid colon, however there is no evidence of diverticulitis. Vascular/Lymphatic: No pathologically enlarged lymph nodes. No acute vascular findings. Reproductive:  No mass or other significant abnormality. Other:  None. Musculoskeletal:  No suspicious bone lesions identified. IMPRESSION: Partial distal small bowel obstruction with transition point in left pelvis, likely due to adhesion. Small hiatal hernia. Colonic diverticulosis, without radiographic evidence of diverticulitis. Cholelithiasis. No radiographic evidence of cholecystitis. Electronically Signed   By: Norleen DELENA Kil M.D.   On: 08/18/2023 11:53    Scheduled Meds:  diatrizoate  meglumine -sodium  90  mL Per NG tube Once   enoxaparin  (LOVENOX ) injection  40 mg Subcutaneous QHS   pantoprazole  (PROTONIX ) IV  40 mg Intravenous QHS   Continuous Infusions:  sodium chloride  125 mL/hr at 08/19/23 0205     LOS: 0 days   Ivonne Mustache, MD [facility] P7/09/2023, 8:22 AM

## 2023-08-20 DIAGNOSIS — K565 Intestinal adhesions [bands], unspecified as to partial versus complete obstruction: Secondary | ICD-10-CM | POA: Diagnosis not present

## 2023-08-20 LAB — BASIC METABOLIC PANEL WITH GFR
Anion gap: 6 (ref 5–15)
BUN: 17 mg/dL (ref 8–23)
CO2: 26 mmol/L (ref 22–32)
Calcium: 7.7 mg/dL — ABNORMAL LOW (ref 8.9–10.3)
Chloride: 104 mmol/L (ref 98–111)
Creatinine, Ser: 1.01 mg/dL (ref 0.61–1.24)
GFR, Estimated: >60 mL/min (ref >60)
Glucose, Bld: 94 mg/dL (ref 70–99)
Potassium: 3.7 mmol/L (ref 3.5–5.1)
Sodium: 136 mmol/L (ref 135–145)

## 2023-08-20 NOTE — Progress Notes (Signed)
 [facility] Progress Note     Subjective: CC:  Continues to feel better. Reports some R sided abdominal/rib soreness last night  that he attributes to sleeping in hospital bed. Patient does tell me that he has had multiple episodes of RUQ pain after eating spicy [place] food.  Also has flares of LLQ pain that is attributes to diverticulitis. This hospital stay he said his pain was epigastric and right lower abdomen.  Objective: Vital signs in last 24 hours: Temp:  [97.9 F (36.6 C)-98.8 F (37.1 C)] 98.6 F (37 C) (07/09 0742) Pulse Rate:  [65-79] 78 (07/09 0742) Resp:  [16-17] 16 (07/09 0742) BP: (131-165)/(69-88) 165/88 (07/09 0742) SpO2:  [95 %-99 %] 95 % (07/09 0742)    Intake/Output from previous day: 07/08 0701 - 07/09 0700 In: 2076 [P.O.:358; I.V.:1718] Out: 500 [Emesis/NG output:500] Intake/Output this shift: No intake/output data recorded.  PE: Gen:  Alert, NAD, pleasant Card:  Regular rate and rhythm, no lower extremity edema  Pulm:  Normal effort ORA Abd: Soft, non-tender, non-distended   Skin: warm and dry, no rashes  Psych: A&Ox3   Lab Results:  Recent Labs    08/18/23 1000 08/19/23 0710  WBC 13.4* 5.8  HGB 17.2* 14.1  HCT 51.2 42.1  PLT 256 176   BMET Recent Labs    08/19/23 0710 08/20/23 0558  NA 139 136  K 3.7 3.7  CL 104 104  CO2 29 26  GLUCOSE 103* 94  BUN 18 17  CREATININE 1.18 1.01  CALCIUM  8.1* 7.7*   PT/INR No results for input(s): LABPROT, INR in the last 72 hours. CMP     Component Value Date/Time   NA 136 08/20/2023 0558   K 3.7 08/20/2023 0558   CL 104 08/20/2023 0558   CO2 26 08/20/2023 0558   GLUCOSE 94 08/20/2023 0558   BUN 17 08/20/2023 0558   CREATININE 1.01 08/20/2023 0558   CREATININE 1.09 02/01/2013 1804   CALCIUM  7.7 (L) 08/20/2023 0558   PROT 7.3 08/18/2023 1000   ALBUMIN 4.5 08/18/2023 1000   AST 21 08/18/2023 1000   ALT 14 08/18/2023 1000   ALKPHOS 56 08/18/2023 1000   BILITOT 1.5 (H)  08/18/2023 1000   GFRNONAA >60 08/20/2023 0558   Lipase     Component Value Date/Time   LIPASE 18 08/18/2023 1000       Studies/Results: DG Abd Portable 1V Result Date: 08/19/2023 CLINICAL DATA:  Small bowel obstruction. EXAM: PORTABLE ABDOMEN - 1 VIEW COMPARISON:  August 18, 2023. FINDINGS: Nasogastric tube tip is seen in proximal stomach. No abnormal bowel dilatation is noted. Residual contrast is noted throughout the nondilated colon. IMPRESSION: No abnormal bowel dilatation. Electronically Signed   By: Lynwood Landy Raddle M.D.   On: 08/19/2023 16:39   DG CHEST PORT 1 VIEW Result Date: 08/18/2023 CLINICAL DATA:  NG tube placement EXAM: PORTABLE CHEST 1 VIEW COMPARISON:  08/19/2012, CT 08/18/2023 FINDINGS: Enteric tube tip at the GE junction region, probably within small hiatal hernia. Mild air distension of upper bowel loops. Diffuse bronchitic changes. Vague slightly rounded left upper lung opacity measuring 19 mm, possible lung nodule IMPRESSION: 1. Enteric tube tip at the GE junction region, probably within small hiatal hernia. Consider further advancement for more optimal positioning 2. Diffuse bronchitic changes. Vague slightly rounded left upper lung opacity, possible lung nodule. Chest CT is recommended for further evaluation Electronically Signed   By: Luke Bun M.D.   On: 08/18/2023 22:13   DG Abd  Portable 1 View Result Date: 08/18/2023 CLINICAL DATA:  NG Tube EXAM: PORTABLE ABDOMEN - 1 VIEW COMPARISON:  August 18, 2023 CT of the abdomen and pelvis FINDINGS: Abnormal gaseous distension of the small bowel in the upper abdomen consistent with patient's small-bowel obstruction noted on the prior CT. Air-fluid level in the stomach. Esophagogastric tube terminates in the stomach. No pneumoperitoneum. No organomegaly or radiopaque calculi. No acute fracture or destructive lesion. Lower lung volumes. No focal airspace consolidation or pleural effusion. IMPRESSION: Well-positioned esophagogastric  tube, terminating in the stomach. Electronically Signed   By: Rogelia Myers M.D.   On: 08/18/2023 17:13   CT ABDOMEN PELVIS W CONTRAST Result Date: 08/18/2023 CLINICAL DATA:  Central and left lower quadrant abdominal pain beginning yesterday. Previous diverticulitis. EXAM: CT ABDOMEN AND PELVIS WITH CONTRAST TECHNIQUE: Multidetector CT imaging of the abdomen and pelvis was performed using the standard protocol following bolus administration of intravenous contrast. RADIATION DOSE REDUCTION: This exam was performed according to the departmental dose-optimization program which includes automated exposure control, adjustment of the mA and/or kV according to patient size and/or use of iterative reconstruction technique. CONTRAST:  OMNIPAQUE  IOHEXOL  300 MG/ML  SOLN COMPARISON:  07/28/2015 FINDINGS: Lower Chest: No acute findings. Hepatobiliary: No suspicious hepatic masses identified. Tiny gallstones noted, without signs of cholecystitis or biliary ductal dilatation. Pancreas:  No mass or inflammatory changes. Spleen: Within normal limits in size and appearance. Adrenals/Urinary Tract: No suspicious masses identified. No evidence of ureteral calculi or hydronephrosis. Unremarkable unopacified urinary bladder. Stomach/Bowel: Small hiatal hernia noted. Moderately dilated fluid-filled small bowel loops are seen in the left abdomen and pelvis with transition point to nondilated distal small bowel seen in the right pelvis adjacent to iliac vessels. This is consistent a partial small bowel obstruction, likely due to adhesion. No No evidence of acute inflammatory process or abscess. Diverticulosis is seen mainly involving the sigmoid colon, however there is no evidence of diverticulitis. Vascular/Lymphatic: No pathologically enlarged lymph nodes. No acute vascular findings. Reproductive:  No mass or other significant abnormality. Other:  None. Musculoskeletal:  No suspicious bone lesions identified. IMPRESSION:  Partial distal small bowel obstruction with transition point in left pelvis, likely due to adhesion. Small hiatal hernia. Colonic diverticulosis, without radiographic evidence of diverticulitis. Cholelithiasis. No radiographic evidence of cholecystitis. Electronically Signed   By: Norleen DELENA Kil M.D.   On: 08/18/2023 11:53    Anti-infectives: Anti-infectives (From admission, onward)    None        Assessment/Plan  SBO, possibly due to intra-abdominal adhesions from previous surgery versus diverticulitis   - afebrile, hemodynamically stable, no emergent need for surgery - SBO protocol 7/8 >> contrast in colon. Tolerating FLD. Allow soft diet.   - patient is stable for discharge from a surgical standpoint. We discussed return precautions. We discussed dietary recommendations. Patient has a known history of gallstones and what sounds like biliary colic at home. I recommended outpatient follow up in our office for cholecystectomy consultation.     LOS: 1 day   I reviewed nursing notes, hospitalist notes, last 24 h vitals and pain scores, last 48 h intake and output, last 24 h labs and trends, and last 24 h imaging results.  This care required moderate level of medical decision making.   Almarie Pringle, PA-C [facility] Please see Amion for pager number during day hours 7:00am-4:30pm

## 2023-08-20 NOTE — Discharge Instructions (Signed)
 Consider a low residue diet for 1-2 weeks, then gradually go back to a high fiber diet.

## 2023-08-20 NOTE — Discharge Summary (Signed)
 Physician Discharge Summary  HURIEL MATT FMW:[id number] DOB: [birth year] DOA: 08/18/2023  PCP: Onita Rush, MD  Admit date: 08/18/2023 Discharge date: 08/20/2023  Admitted From: Home Disposition:  Home  Discharge Condition:Stable CODE STATUS:FULL Diet recommendation: regular  Brief/Interim Summary: Patient is a 68 year old male with history of TIA, hyperlipidemia, diverticulitis who presented to the emergency department with complaint of abdominal pain was started on 7/6.  Pain was described as sharp and stabbing in nature.  History of nausea and vomiting.  On presentation, he was mildly hypertensive but otherwise hemodynamically stable.  Lab work showed WBC count of 13.4, potassium of 4.3, lipase of 18.  CT abdomen/pelvis with contrast showed partial distal small bowel obstruction with transition point in the left pelvis likely due to adhesions, small hiatal hernia, colonic diverticulosis without diverticulitis, cholelithiasis without cholecystitis.  Patient admitted for the management of small bowel obstruction.  NG tube placed.  General surgery was following.  He clinically improved significantly.  No nausea, vomiting or abdominal pain today.  He had multiple bowel movements.  Tolerating solid diet.  General surgery cleared for discharge.  Medically stable for discharge to home.   Discharge Diagnoses:  Principal Problem:   Small bowel obstruction due to adhesions [facility]) Active Problems:   Hyperlipidemia   Leukocytosis   Dehydration   History of TIA (transient ischemic attack)   SBO (small bowel obstruction) [facility])    Discharge Instructions  Discharge Instructions     Diet general   Complete by: As directed    Discharge instructions   Complete by: As directed    1)Follow up with general surgery as an outpatient   Increase activity slowly   Complete by: As directed       Allergies as of 08/20/2023   No Known Allergies      Medication List    You have not been prescribed  any medications.     Follow-up Information     Surgery, [facility]. Call.   Specialty: General Surgery Why: As needed, for consultation regarding symptomatic gallstones, cholecystectomy. Contact information: [address] [place] [address] [phone]                No Known Allergies  Consultations: General Surgery   Procedures/Studies: DG Abd Portable 1V Result Date: 08/19/2023 CLINICAL DATA:  Small bowel obstruction. EXAM: PORTABLE ABDOMEN - 1 VIEW COMPARISON:  August 18, 2023. FINDINGS: Nasogastric tube tip is seen in proximal stomach. No abnormal bowel dilatation is noted. Residual contrast is noted throughout the nondilated colon. IMPRESSION: No abnormal bowel dilatation. Electronically Signed   By: Lynwood Landy Raddle M.D.   On: 08/19/2023 16:39   DG CHEST PORT 1 VIEW Result Date: 08/18/2023 CLINICAL DATA:  NG tube placement EXAM: PORTABLE CHEST 1 VIEW COMPARISON:  08/19/2012, CT 08/18/2023 FINDINGS: Enteric tube tip at the GE junction region, probably within small hiatal hernia. Mild air distension of upper bowel loops. Diffuse bronchitic changes. Vague slightly rounded left upper lung opacity measuring 19 mm, possible lung nodule IMPRESSION: 1. Enteric tube tip at the GE junction region, probably within small hiatal hernia. Consider further advancement for more optimal positioning 2. Diffuse bronchitic changes. Vague slightly rounded left upper lung opacity, possible lung nodule. Chest CT is recommended for further evaluation Electronically Signed   By: Luke Bun M.D.   On: 08/18/2023 22:13   DG Abd Portable 1 View Result Date: 08/18/2023 CLINICAL DATA:  NG Tube EXAM: PORTABLE ABDOMEN - 1 VIEW COMPARISON:  August 18, 2023 CT of the abdomen and pelvis FINDINGS: Abnormal gaseous distension of the small bowel in the upper abdomen consistent with patient's small-bowel obstruction noted on the prior CT. Air-fluid level in the stomach. Esophagogastric tube  terminates in the stomach. No pneumoperitoneum. No organomegaly or radiopaque calculi. No acute fracture or destructive lesion. Lower lung volumes. No focal airspace consolidation or pleural effusion. IMPRESSION: Well-positioned esophagogastric tube, terminating in the stomach. Electronically Signed   By: Rogelia Myers M.D.   On: 08/18/2023 17:13   CT ABDOMEN PELVIS W CONTRAST Result Date: 08/18/2023 CLINICAL DATA:  Central and left lower quadrant abdominal pain beginning yesterday. Previous diverticulitis. EXAM: CT ABDOMEN AND PELVIS WITH CONTRAST TECHNIQUE: Multidetector CT imaging of the abdomen and pelvis was performed using the standard protocol following bolus administration of intravenous contrast. RADIATION DOSE REDUCTION: This exam was performed according to the departmental dose-optimization program which includes automated exposure control, adjustment of the mA and/or kV according to patient size and/or use of iterative reconstruction technique. CONTRAST:  OMNIPAQUE  IOHEXOL  300 MG/ML  SOLN COMPARISON:  07/28/2015 FINDINGS: Lower Chest: No acute findings. Hepatobiliary: No suspicious hepatic masses identified. Tiny gallstones noted, without signs of cholecystitis or biliary ductal dilatation. Pancreas:  No mass or inflammatory changes. Spleen: Within normal limits in size and appearance. Adrenals/Urinary Tract: No suspicious masses identified. No evidence of ureteral calculi or hydronephrosis. Unremarkable unopacified urinary bladder. Stomach/Bowel: Small hiatal hernia noted. Moderately dilated fluid-filled small bowel loops are seen in the left abdomen and pelvis with transition point to nondilated distal small bowel seen in the right pelvis adjacent to iliac vessels. This is consistent a partial small bowel obstruction, likely due to adhesion. No No evidence of acute inflammatory process or abscess. Diverticulosis is seen mainly involving the sigmoid colon, however there is no evidence of  diverticulitis. Vascular/Lymphatic: No pathologically enlarged lymph nodes. No acute vascular findings. Reproductive:  No mass or other significant abnormality. Other:  None. Musculoskeletal:  No suspicious bone lesions identified. IMPRESSION: Partial distal small bowel obstruction with transition point in left pelvis, likely due to adhesion. Small hiatal hernia. Colonic diverticulosis, without radiographic evidence of diverticulitis. Cholelithiasis. No radiographic evidence of cholecystitis. Electronically Signed   By: Norleen DELENA Kil M.D.   On: 08/18/2023 11:53      Subjective: Patient seen and examined at the bedside this morning.  He is ready to go.  He was discharged.  General surgery cleared for discharge.  No abdomen pain, nausea or vomiting.  Discharge Exam: Vitals:   08/20/23 0439 08/20/23 0742  BP: 134/73 (!) 165/88  Pulse: 65 78  Resp:  16  Temp: 97.9 F (36.6 C) 98.6 F (37 C)  SpO2: 96% 95%   Vitals:   08/19/23 2058 08/20/23 0439 08/20/23 0742 08/20/23 0949  BP: 131/69 134/73 (!) 165/88   Pulse: 70 65 78   Resp:   16   Temp: 98.8 F (37.1 C) 97.9 F (36.6 C) 98.6 F (37 C)   TempSrc: Oral Oral Oral   SpO2: 96% 96% 95%   Weight:    78.5 kg  Height:    5' 10 (1.778 m)    General: Pt is alert, awake, not in acute distress Cardiovascular: RRR, S1/S2 +, no rubs, no gallops Respiratory: CTA bilaterally, no wheezing, no rhonchi Abdominal: Soft, NT, ND, bowel sounds + Extremities: no edema, no cyanosis    The results of significant diagnostics from this hospitalization (including imaging, microbiology, ancillary and laboratory) are listed below for reference.  Microbiology: No results found for this or any previous visit (from the past 240 hours).   Labs: BNP (last 3 results) No results for input(s): BNP in the last 8760 hours. Basic Metabolic Panel: Recent Labs  Lab 08/18/23 1000 08/19/23 0710 08/20/23 0558  NA 137 139 136  K 4.3 3.7 3.7  CL 98 104  104  CO2 29 29 26   GLUCOSE 144* 103* 94  BUN 18 18 17   CREATININE 1.08 1.18 1.01  CALCIUM  9.8 8.1* 7.7*  MG  --  1.9  --    Liver Function Tests: Recent Labs  Lab 08/18/23 1000  AST 21  ALT 14  ALKPHOS 56  BILITOT 1.5*  PROT 7.3  ALBUMIN 4.5   Recent Labs  Lab 08/18/23 1000  LIPASE 18   No results for input(s): AMMONIA in the last 168 hours. CBC: Recent Labs  Lab 08/18/23 1000 08/19/23 0710  WBC 13.4* 5.8  HGB 17.2* 14.1  HCT 51.2 42.1  MCV 83.4 82.9  PLT 256 176   Cardiac Enzymes: No results for input(s): CKTOTAL, CKMB, CKMBINDEX, TROPONINI in the last 168 hours. BNP: Invalid input(s): POCBNP CBG: No results for input(s): GLUCAP in the last 168 hours. D-Dimer No results for input(s): DDIMER in the last 72 hours. Hgb A1c No results for input(s): HGBA1C in the last 72 hours. Lipid Profile No results for input(s): CHOL, HDL, LDLCALC, TRIG, CHOLHDL, LDLDIRECT in the last 72 hours. Thyroid  function studies No results for input(s): TSH, T4TOTAL, T3FREE, THYROIDAB in the last 72 hours.  Invalid input(s): FREET3 Anemia work up No results for input(s): VITAMINB12, FOLATE, FERRITIN, TIBC, IRON, RETICCTPCT in the last 72 hours. Urinalysis No results found for: COLORURINE, APPEARANCEUR, LABSPEC, PHURINE, GLUCOSEU, HGBUR, BILIRUBINUR, KETONESUR, PROTEINUR, UROBILINOGEN, NITRITE, LEUKOCYTESUR Sepsis Labs Recent Labs  Lab 08/18/23 1000 08/19/23 0710  WBC 13.4* 5.8   Microbiology No results found for this or any previous visit (from the past 240 hours).  Please note: You were cared for by a hospitalist during your hospital stay. Once you are discharged, your primary care physician will handle any further medical issues. Please note that NO REFILLS for any discharge medications will be authorized once you are discharged, as it is imperative that you return to your primary care physician (or  establish a relationship with a primary care physician if you do not have one) for your post hospital discharge needs so that they can reassess your need for medications and monitor your lab values.    Time coordinating discharge: 40 minutes  SIGNED:   Ivonne Mustache, MD  [facility] 08/20/2023, 10:24 AM Pager [phone]  If 7PM-7AM, please contact night-coverage [email or web address] Password [email or web address]

## 2023-08-20 NOTE — Plan of Care (Signed)

## 2023-08-20 NOTE — Progress Notes (Signed)
 See note under IV removal. Pt requesting to leave IV out due to him being discharged in the morning.

## 2023-08-22 DIAGNOSIS — Z85828 Personal history of other malignant neoplasm of skin: Secondary | ICD-10-CM | POA: Diagnosis not present

## 2023-08-22 DIAGNOSIS — D225 Melanocytic nevi of trunk: Secondary | ICD-10-CM | POA: Diagnosis not present

## 2023-08-22 DIAGNOSIS — L570 Actinic keratosis: Secondary | ICD-10-CM | POA: Diagnosis not present

## 2023-08-22 DIAGNOSIS — L820 Inflamed seborrheic keratosis: Secondary | ICD-10-CM | POA: Diagnosis not present

## 2023-08-22 DIAGNOSIS — L814 Other melanin hyperpigmentation: Secondary | ICD-10-CM | POA: Diagnosis not present

## 2023-08-22 DIAGNOSIS — D485 Neoplasm of uncertain behavior of skin: Secondary | ICD-10-CM | POA: Diagnosis not present

## 2023-08-22 DIAGNOSIS — L821 Other seborrheic keratosis: Secondary | ICD-10-CM | POA: Diagnosis not present

## 2023-09-10 DIAGNOSIS — H02844 Edema of left upper eyelid: Secondary | ICD-10-CM | POA: Diagnosis not present

## 2023-09-10 DIAGNOSIS — H00024 Hordeolum internum left upper eyelid: Secondary | ICD-10-CM | POA: Diagnosis not present

## 2023-11-14 DIAGNOSIS — C44729 Squamous cell carcinoma of skin of left lower limb, including hip: Secondary | ICD-10-CM | POA: Diagnosis not present

## 2023-11-14 DIAGNOSIS — Z85828 Personal history of other malignant neoplasm of skin: Secondary | ICD-10-CM | POA: Diagnosis not present
